# Patient Record
Sex: Female | Born: 1960 | Marital: Single | Smoking: Never smoker
Health system: Southern US, Community
[De-identification: ages and names within clinical notes are randomized; demographics above are authoritative.]

## PROBLEM LIST (undated history)

## (undated) DIAGNOSIS — I10 Essential (primary) hypertension: Secondary | ICD-10-CM

## (undated) DIAGNOSIS — R279 Unspecified lack of coordination: Secondary | ICD-10-CM

## (undated) DIAGNOSIS — R42 Dizziness and giddiness: Secondary | ICD-10-CM

## (undated) DIAGNOSIS — K219 Gastro-esophageal reflux disease without esophagitis: Secondary | ICD-10-CM

## (undated) DIAGNOSIS — E119 Type 2 diabetes mellitus without complications: Secondary | ICD-10-CM

## (undated) DIAGNOSIS — I639 Cerebral infarction, unspecified: Secondary | ICD-10-CM

## (undated) DIAGNOSIS — R4701 Aphasia: Secondary | ICD-10-CM

## (undated) DIAGNOSIS — G8190 Hemiplegia, unspecified affecting unspecified side: Secondary | ICD-10-CM

## (undated) HISTORY — DX: Essential (primary) hypertension: I10

## (undated) HISTORY — DX: Dizziness and giddiness: R42

## (undated) HISTORY — DX: Type 2 diabetes mellitus without complications: E11.9

## (undated) HISTORY — DX: Unspecified lack of coordination: R27.9

## (undated) HISTORY — DX: Gastro-esophageal reflux disease without esophagitis: K21.9

---

## 2007-05-07 ENCOUNTER — Other Ambulatory Visit: Payer: Self-pay

## 2007-05-07 ENCOUNTER — Inpatient Hospital Stay: Payer: Self-pay | Admitting: Internal Medicine

## 2007-05-29 ENCOUNTER — Encounter: Payer: Self-pay | Admitting: Internal Medicine

## 2007-06-03 ENCOUNTER — Encounter: Payer: Self-pay | Admitting: Internal Medicine

## 2007-07-04 ENCOUNTER — Encounter: Payer: Self-pay | Admitting: Internal Medicine

## 2007-08-04 ENCOUNTER — Encounter: Payer: Self-pay | Admitting: Internal Medicine

## 2007-09-03 ENCOUNTER — Encounter: Payer: Self-pay | Admitting: Internal Medicine

## 2007-10-04 ENCOUNTER — Encounter: Payer: Self-pay | Admitting: Internal Medicine

## 2007-11-03 ENCOUNTER — Encounter: Payer: Self-pay | Admitting: Internal Medicine

## 2012-06-02 ENCOUNTER — Ambulatory Visit: Payer: Self-pay | Admitting: Internal Medicine

## 2012-06-05 ENCOUNTER — Inpatient Hospital Stay: Payer: Self-pay | Admitting: Internal Medicine

## 2012-06-05 LAB — TSH: Thyroid Stimulating Horm: 2.7 u[IU]/mL

## 2012-06-05 LAB — URINALYSIS, COMPLETE
Bacteria: NONE SEEN
Glucose,UR: 150 mg/dL (ref 0–75)
Ph: 7 (ref 4.5–8.0)
Protein: 100
RBC,UR: <1 /HPF (ref 0–5)
Specific Gravity: 1.018 (ref 1.003–1.030)
Squamous Epithelial: 1

## 2012-06-05 LAB — COMPREHENSIVE METABOLIC PANEL
Albumin: 4.2 g/dL (ref 3.4–5.0)
Anion Gap: 11 (ref 7–16)
BUN: 17 mg/dL (ref 7–18)
Bilirubin,Total: 0.8 mg/dL (ref 0.2–1.0)
Co2: 29 mmol/L (ref 21–32)
Creatinine: 0.98 mg/dL (ref 0.60–1.30)
EGFR (African American): >60
Glucose: 194 mg/dL — ABNORMAL HIGH (ref 65–99)
Osmolality: 284 (ref 275–301)
SGOT(AST): 28 U/L (ref 15–37)
Sodium: 139 mmol/L (ref 136–145)
Total Protein: 9.5 g/dL — ABNORMAL HIGH (ref 6.4–8.2)

## 2012-06-05 LAB — CK TOTAL AND CKMB (NOT AT ARMC): CK-MB: 1.2 ng/mL (ref 0.5–3.6)

## 2012-06-05 LAB — CBC
HCT: 49.1 % — ABNORMAL HIGH (ref 35.0–47.0)
HGB: 15.8 g/dL (ref 12.0–16.0)
MCH: 28.1 pg (ref 26.0–34.0)
MCV: 88 fL (ref 80–100)
Platelet: 245 10*3/uL (ref 150–440)
RBC: 5.61 10*6/uL — ABNORMAL HIGH (ref 3.80–5.20)
RDW: 13.8 % (ref 11.5–14.5)
WBC: 8.9 10*3/uL (ref 3.6–11.0)

## 2012-06-05 LAB — PROTIME-INR: INR: 0.9

## 2012-06-05 LAB — TROPONIN I: Troponin-I: 0.03 ng/mL

## 2012-06-05 LAB — APTT: Activated PTT: 34.2 secs (ref 23.6–35.9)

## 2012-06-06 DIAGNOSIS — I517 Cardiomegaly: Secondary | ICD-10-CM

## 2012-06-06 LAB — BASIC METABOLIC PANEL
Anion Gap: 8 (ref 7–16)
BUN: 13 mg/dL (ref 7–18)
Co2: 26 mmol/L (ref 21–32)
Creatinine: 1.04 mg/dL (ref 0.60–1.30)
EGFR (African American): >60
EGFR (Non-African Amer.): >60
Glucose: 222 mg/dL — ABNORMAL HIGH (ref 65–99)
Osmolality: 288 (ref 275–301)
Sodium: 141 mmol/L (ref 136–145)

## 2012-06-06 LAB — TROPONIN I
Troponin-I: 0.06 ng/mL — ABNORMAL HIGH
Troponin-I: 0.06 ng/mL — ABNORMAL HIGH

## 2012-06-06 LAB — CBC WITH DIFFERENTIAL/PLATELET
Basophil %: 0.7 %
Eosinophil #: 0.1 10*3/uL (ref 0.0–0.7)
Lymphocyte #: 3.1 10*3/uL (ref 1.0–3.6)
MCH: 28.5 pg (ref 26.0–34.0)
MCHC: 32.9 g/dL (ref 32.0–36.0)
MCV: 87 fL (ref 80–100)
Monocyte #: 1.2 x10 3/mm — ABNORMAL HIGH (ref 0.2–0.9)
Neutrophil %: 63.5 %
Platelet: 233 10*3/uL (ref 150–440)

## 2012-06-06 LAB — LIPID PANEL
HDL Cholesterol: 55 mg/dL (ref 40–60)
Triglycerides: 98 mg/dL (ref 0–200)

## 2012-06-06 LAB — MAGNESIUM: Magnesium: 1.7 mg/dL — ABNORMAL LOW

## 2012-06-07 LAB — HEMOGLOBIN A1C: Hemoglobin A1C: 8.9 % — ABNORMAL HIGH (ref 4.2–6.3)

## 2012-06-07 LAB — BASIC METABOLIC PANEL
Anion Gap: 10 (ref 7–16)
BUN: 8 mg/dL (ref 7–18)
EGFR (African American): >60
EGFR (Non-African Amer.): >60
Glucose: 189 mg/dL — ABNORMAL HIGH (ref 65–99)
Potassium: 3.5 mmol/L (ref 3.5–5.1)

## 2012-06-07 LAB — MAGNESIUM: Magnesium: 1.9 mg/dL

## 2012-06-08 ENCOUNTER — Ambulatory Visit: Payer: Self-pay | Admitting: Neurology

## 2012-06-08 LAB — URINALYSIS, COMPLETE
Bilirubin,UR: NEGATIVE
Glucose,UR: 50 mg/dL (ref 0–75)
Hyaline Cast: 3
Nitrite: NEGATIVE
Ph: 6 (ref 4.5–8.0)
RBC,UR: 469 /HPF (ref 0–5)
Squamous Epithelial: <1
WBC UR: 21 /HPF (ref 0–5)

## 2012-06-08 LAB — SEDIMENTATION RATE: Erythrocyte Sed Rate: 4 mm/hr (ref 0–20)

## 2012-06-10 LAB — URINE CULTURE

## 2012-06-11 LAB — CREATININE, SERUM
EGFR (African American): >60
EGFR (Non-African Amer.): >60

## 2012-06-11 LAB — PHOSPHORUS: Phosphorus: 3.1 mg/dL (ref 2.5–4.9)

## 2012-06-11 LAB — MAGNESIUM: Magnesium: 1.4 mg/dL — ABNORMAL LOW

## 2012-06-12 DIAGNOSIS — G459 Transient cerebral ischemic attack, unspecified: Secondary | ICD-10-CM

## 2012-06-12 LAB — MAGNESIUM: Magnesium: 1.9 mg/dL

## 2012-06-12 LAB — PHOSPHORUS: Phosphorus: 3.2 mg/dL (ref 2.5–4.9)

## 2012-06-12 LAB — POTASSIUM: Potassium: 3.4 mmol/L — ABNORMAL LOW

## 2012-06-12 LAB — CALCIUM: Calcium, Total: 8.4 mg/dL — ABNORMAL LOW

## 2012-06-13 LAB — PROT IMMUNOELECTROPHORES(ARMC)

## 2012-06-14 LAB — CALCIUM: Calcium, Total: 8.4 mg/dL — ABNORMAL LOW (ref 8.5–10.1)

## 2012-06-14 LAB — HEMOGLOBIN A1C: Hemoglobin A1C: 8.9 % — ABNORMAL HIGH (ref 4.2–6.3)

## 2012-06-15 LAB — POTASSIUM: Potassium: 3 mmol/L — ABNORMAL LOW (ref 3.5–5.1)

## 2012-06-15 LAB — MAGNESIUM: Magnesium: 1.9 mg/dL

## 2012-06-16 LAB — CBC WITH DIFFERENTIAL/PLATELET
Basophil %: 0.5 %
Eosinophil #: 0.3 10*3/uL (ref 0.0–0.7)
Eosinophil %: 3.2 %
Lymphocyte #: 1.5 10*3/uL (ref 1.0–3.6)
MCHC: 32.7 g/dL (ref 32.0–36.0)
MCV: 89 fL (ref 80–100)
Monocyte #: 1.1 x10 3/mm — ABNORMAL HIGH (ref 0.2–0.9)
Monocyte %: 12.9 %
Neutrophil #: 5.5 10*3/uL (ref 1.4–6.5)
Neutrophil %: 65.5 %
Platelet: 212 10*3/uL (ref 150–440)
RBC: 4.11 10*6/uL (ref 3.80–5.20)
RDW: 15.1 % — ABNORMAL HIGH (ref 11.5–14.5)
WBC: 8.3 10*3/uL (ref 3.6–11.0)

## 2012-06-16 LAB — BASIC METABOLIC PANEL
Anion Gap: 7 (ref 7–16)
BUN: 24 mg/dL — ABNORMAL HIGH (ref 7–18)
Chloride: 107 mmol/L (ref 98–107)
Co2: 31 mmol/L (ref 21–32)
Creatinine: 1.11 mg/dL (ref 0.60–1.30)
EGFR (African American): >60
EGFR (Non-African Amer.): 58 — ABNORMAL LOW
Glucose: 441 mg/dL — ABNORMAL HIGH (ref 65–99)
Osmolality: 312 (ref 275–301)

## 2012-06-16 LAB — MAGNESIUM: Magnesium: 1.9 mg/dL

## 2012-06-17 LAB — POTASSIUM: Potassium: 3.7 mmol/L (ref 3.5–5.1)

## 2012-06-17 LAB — MAGNESIUM: Magnesium: 1.8 mg/dL

## 2012-06-17 LAB — PHOSPHORUS: Phosphorus: 3.6 mg/dL (ref 2.5–4.9)

## 2012-06-17 LAB — ALBUMIN: Albumin: 2.5 g/dL — ABNORMAL LOW (ref 3.4–5.0)

## 2012-06-19 LAB — CBC WITH DIFFERENTIAL/PLATELET
Bands: 1 %
Comment - H1-Com1: NORMAL
HCT: 36.6 % (ref 35.0–47.0)
HGB: 11.6 g/dL — ABNORMAL LOW (ref 12.0–16.0)
Lymphocytes: 21 %
MCH: 28.3 pg (ref 26.0–34.0)
MCHC: 31.7 g/dL — ABNORMAL LOW (ref 32.0–36.0)
MCV: 89 fL (ref 80–100)
Monocytes: 9 %
Platelet: 210 10*3/uL (ref 150–440)
RBC: 4.11 10*6/uL (ref 3.80–5.20)
Segmented Neutrophils: 69 %

## 2012-06-19 LAB — URINALYSIS, COMPLETE
Bilirubin,UR: NEGATIVE
Glucose,UR: 50 mg/dL (ref 0–75)
Ketone: NEGATIVE
Protein: 100
RBC,UR: 15 /HPF (ref 0–5)
Specific Gravity: 1.015 (ref 1.003–1.030)
WBC UR: 191 /HPF (ref 0–5)

## 2012-06-19 LAB — PHOSPHORUS: Phosphorus: 3.1 mg/dL (ref 2.5–4.9)

## 2012-06-19 LAB — MAGNESIUM: Magnesium: 1.7 mg/dL — ABNORMAL LOW

## 2012-06-20 LAB — CBC WITH DIFFERENTIAL/PLATELET
Basophil #: 0.1 10*3/uL (ref 0.0–0.1)
Eosinophil #: 0.2 10*3/uL (ref 0.0–0.7)
HCT: 38.3 % (ref 35.0–47.0)
Lymphocyte %: 10.6 %
MCHC: 32.5 g/dL (ref 32.0–36.0)
Monocyte %: 13.9 %
Neutrophil #: 17 10*3/uL — ABNORMAL HIGH (ref 1.4–6.5)
Platelet: 233 10*3/uL (ref 150–440)
RBC: 4.34 10*6/uL (ref 3.80–5.20)
RDW: 14.7 % — ABNORMAL HIGH (ref 11.5–14.5)

## 2012-06-20 LAB — BASIC METABOLIC PANEL
Anion Gap: 10 (ref 7–16)
BUN: 33 mg/dL — ABNORMAL HIGH (ref 7–18)
Creatinine: 1.22 mg/dL (ref 0.60–1.30)
EGFR (African American): >60
EGFR (Non-African Amer.): 52 — ABNORMAL LOW
Glucose: 128 mg/dL — ABNORMAL HIGH (ref 65–99)
Osmolality: 283 (ref 275–301)
Potassium: 3.6 mmol/L (ref 3.5–5.1)

## 2012-06-20 LAB — CALCIUM: Calcium, Total: 9.2 mg/dL (ref 8.5–10.1)

## 2012-06-20 LAB — MAGNESIUM: Magnesium: 2.3 mg/dL

## 2012-06-21 LAB — CBC WITH DIFFERENTIAL/PLATELET
Basophil %: 0.3 %
Eosinophil %: 0.8 %
HCT: 39.8 % (ref 35.0–47.0)
HGB: 12.6 g/dL (ref 12.0–16.0)
Lymphocyte %: 12.5 %
MCH: 28 pg (ref 26.0–34.0)
MCHC: 31.8 g/dL — ABNORMAL LOW (ref 32.0–36.0)
Monocyte #: 2.7 x10 3/mm — ABNORMAL HIGH (ref 0.2–0.9)
Monocyte %: 11.1 %
Neutrophil %: 75.3 %
RBC: 4.51 10*6/uL (ref 3.80–5.20)
WBC: 24.5 10*3/uL — ABNORMAL HIGH (ref 3.6–11.0)

## 2012-06-21 LAB — BASIC METABOLIC PANEL
BUN: 33 mg/dL — ABNORMAL HIGH (ref 7–18)
Creatinine: 1.18 mg/dL (ref 0.60–1.30)
EGFR (African American): >60
EGFR (Non-African Amer.): 54 — ABNORMAL LOW
Glucose: 82 mg/dL (ref 65–99)
Osmolality: 278 (ref 275–301)
Sodium: 136 mmol/L (ref 136–145)

## 2012-06-21 LAB — PHOSPHORUS: Phosphorus: 3.5 mg/dL (ref 2.5–4.9)

## 2012-06-21 LAB — SEDIMENTATION RATE: Erythrocyte Sed Rate: 12 mm/hr (ref 0–20)

## 2012-06-22 LAB — CBC WITH DIFFERENTIAL/PLATELET
Basophil %: 0.2 %
Eosinophil %: 1.2 %
HCT: 39.9 % (ref 35.0–47.0)
Lymphocyte %: 8.3 %
MCV: 89 fL (ref 80–100)
Monocyte #: 1.8 x10 3/mm — ABNORMAL HIGH (ref 0.2–0.9)
Monocyte %: 9.1 %
Neutrophil %: 81.2 %
Platelet: 260 10*3/uL (ref 150–440)
RBC: 4.5 10*6/uL (ref 3.80–5.20)
WBC: 19.3 10*3/uL — ABNORMAL HIGH (ref 3.6–11.0)

## 2012-06-22 LAB — URINE CULTURE

## 2012-06-23 LAB — BASIC METABOLIC PANEL
Anion Gap: 11 (ref 7–16)
Calcium, Total: 9.3 mg/dL (ref 8.5–10.1)
Co2: 30 mmol/L (ref 21–32)
Creatinine: 1.19 mg/dL (ref 0.60–1.30)
EGFR (African American): >60
Potassium: 3.8 mmol/L (ref 3.5–5.1)
Sodium: 137 mmol/L (ref 136–145)

## 2012-06-23 LAB — CBC WITH DIFFERENTIAL/PLATELET
Basophil #: 0.1 10*3/uL (ref 0.0–0.1)
Basophil %: 0.3 %
Eosinophil #: 0.4 10*3/uL (ref 0.0–0.7)
Eosinophil %: 2.3 %
HCT: 33.1 % — ABNORMAL LOW (ref 35.0–47.0)
HGB: 10.9 g/dL — ABNORMAL LOW (ref 12.0–16.0)
Lymphocyte #: 1.8 10*3/uL (ref 1.0–3.6)
MCH: 28.8 pg (ref 26.0–34.0)
MCV: 88 fL (ref 80–100)
Monocyte #: 1.6 x10 3/mm — ABNORMAL HIGH (ref 0.2–0.9)
Monocyte %: 9.2 %
Neutrophil #: 13.1 10*3/uL — ABNORMAL HIGH (ref 1.4–6.5)
RBC: 3.78 10*6/uL — ABNORMAL LOW (ref 3.80–5.20)
RDW: 14.6 % — ABNORMAL HIGH (ref 11.5–14.5)
WBC: 16.9 10*3/uL — ABNORMAL HIGH (ref 3.6–11.0)

## 2012-06-23 LAB — VANCOMYCIN, TROUGH: Vancomycin, Trough: 13 ug/mL (ref 10–20)

## 2012-06-24 LAB — CBC WITH DIFFERENTIAL/PLATELET
Basophil %: 0.5 %
Eosinophil #: 0.4 10*3/uL (ref 0.0–0.7)
Eosinophil %: 2.8 %
HCT: 38.2 % (ref 35.0–47.0)
HGB: 11.8 g/dL — ABNORMAL LOW (ref 12.0–16.0)
Lymphocyte #: 1.6 10*3/uL (ref 1.0–3.6)
Lymphocyte %: 11 %
MCHC: 30.9 g/dL — ABNORMAL LOW (ref 32.0–36.0)
MCV: 89 fL (ref 80–100)
Neutrophil #: 10.9 10*3/uL — ABNORMAL HIGH (ref 1.4–6.5)
RBC: 4.28 10*6/uL (ref 3.80–5.20)
RDW: 14.9 % — ABNORMAL HIGH (ref 11.5–14.5)

## 2012-06-25 LAB — CBC WITH DIFFERENTIAL/PLATELET
Basophil #: 0 10*3/uL (ref 0.0–0.1)
Eosinophil #: 0.1 10*3/uL (ref 0.0–0.7)
HCT: 35.3 % (ref 35.0–47.0)
HGB: 10.8 g/dL — ABNORMAL LOW (ref 12.0–16.0)
Lymphocyte %: 6.8 %
MCH: 27 pg (ref 26.0–34.0)
MCHC: 30.6 g/dL — ABNORMAL LOW (ref 32.0–36.0)
MCV: 88 fL (ref 80–100)
Monocyte #: 1.9 x10 3/mm — ABNORMAL HIGH (ref 0.2–0.9)
Neutrophil #: 17.7 10*3/uL — ABNORMAL HIGH (ref 1.4–6.5)
Neutrophil %: 83.8 %
RDW: 14.5 % (ref 11.5–14.5)

## 2012-06-25 LAB — CULTURE, BLOOD (SINGLE)

## 2012-06-26 LAB — BASIC METABOLIC PANEL
Anion Gap: 10 (ref 7–16)
BUN: 20 mg/dL — ABNORMAL HIGH (ref 7–18)
Chloride: 100 mmol/L (ref 98–107)
Co2: 26 mmol/L (ref 21–32)
Creatinine: 1.21 mg/dL (ref 0.60–1.30)
EGFR (Non-African Amer.): 52 — ABNORMAL LOW
Glucose: 92 mg/dL (ref 65–99)
Osmolality: 274 (ref 275–301)
Potassium: 4.2 mmol/L (ref 3.5–5.1)
Sodium: 136 mmol/L (ref 136–145)

## 2012-06-26 LAB — CBC WITH DIFFERENTIAL/PLATELET
Basophil %: 0.3 %
Eosinophil %: 1.8 %
HGB: 11.5 g/dL — ABNORMAL LOW (ref 12.0–16.0)
Lymphocyte #: 2.5 10*3/uL (ref 1.0–3.6)
Lymphocyte %: 13.2 %
MCH: 28.9 pg (ref 26.0–34.0)
MCV: 88 fL (ref 80–100)
Monocyte #: 2 x10 3/mm — ABNORMAL HIGH (ref 0.2–0.9)
Neutrophil %: 74.2 %
Platelet: 511 10*3/uL — ABNORMAL HIGH (ref 150–440)
RBC: 3.99 10*6/uL (ref 3.80–5.20)
WBC: 19.3 10*3/uL — ABNORMAL HIGH (ref 3.6–11.0)

## 2012-06-27 LAB — BASIC METABOLIC PANEL
BUN: 17 mg/dL (ref 7–18)
Calcium, Total: 9.2 mg/dL (ref 8.5–10.1)
EGFR (African American): >60
EGFR (Non-African Amer.): >60
Glucose: 91 mg/dL (ref 65–99)
Osmolality: 269 (ref 275–301)
Sodium: 134 mmol/L — ABNORMAL LOW (ref 136–145)

## 2012-06-27 LAB — CBC WITH DIFFERENTIAL/PLATELET
Eosinophil %: 1.3 %
HCT: 35 % (ref 35.0–47.0)
Lymphocyte #: 1.8 10*3/uL (ref 1.0–3.6)
MCV: 87 fL (ref 80–100)
Monocyte %: 9.2 %
Neutrophil #: 16.5 10*3/uL — ABNORMAL HIGH (ref 1.4–6.5)
Neutrophil %: 80.6 %
Platelet: 622 10*3/uL — ABNORMAL HIGH (ref 150–440)
RBC: 4.01 10*6/uL (ref 3.80–5.20)
WBC: 20.4 10*3/uL — ABNORMAL HIGH (ref 3.6–11.0)

## 2012-06-28 LAB — CBC WITH DIFFERENTIAL/PLATELET
Basophil #: 0.1 10*3/uL (ref 0.0–0.1)
Basophil %: 0.5 %
Eosinophil #: 0.3 10*3/uL (ref 0.0–0.7)
HCT: 36.1 % (ref 35.0–47.0)
HGB: 12.4 g/dL (ref 12.0–16.0)
Lymphocyte #: 2.4 10*3/uL (ref 1.0–3.6)
Lymphocyte %: 18.9 %
MCH: 29.5 pg (ref 26.0–34.0)
MCHC: 34.4 g/dL (ref 32.0–36.0)
MCV: 86 fL (ref 80–100)
Monocyte #: 1.3 x10 3/mm — ABNORMAL HIGH (ref 0.2–0.9)
Neutrophil #: 8.5 10*3/uL — ABNORMAL HIGH (ref 1.4–6.5)
Platelet: 734 10*3/uL — ABNORMAL HIGH (ref 150–440)
RDW: 14.4 % (ref 11.5–14.5)
WBC: 12.6 10*3/uL — ABNORMAL HIGH (ref 3.6–11.0)

## 2012-06-28 LAB — BASIC METABOLIC PANEL
Anion Gap: 10 (ref 7–16)
Calcium, Total: 8.8 mg/dL (ref 8.5–10.1)
Chloride: 100 mmol/L (ref 98–107)
Co2: 26 mmol/L (ref 21–32)
Osmolality: 277 (ref 275–301)
Potassium: 4.3 mmol/L (ref 3.5–5.1)

## 2012-06-30 LAB — CBC WITH DIFFERENTIAL/PLATELET
Basophil %: 0.7 %
Eosinophil #: 0.3 10*3/uL (ref 0.0–0.7)
Eosinophil %: 2.6 %
HGB: 12.8 g/dL (ref 12.0–16.0)
Lymphocyte #: 4.1 10*3/uL — ABNORMAL HIGH (ref 1.0–3.6)
Lymphocyte %: 32.1 %
MCH: 29.4 pg (ref 26.0–34.0)
MCHC: 34.3 g/dL (ref 32.0–36.0)
MCV: 86 fL (ref 80–100)
Monocyte #: 1.4 x10 3/mm — ABNORMAL HIGH (ref 0.2–0.9)
Platelet: 919 10*3/uL — ABNORMAL HIGH (ref 150–440)
RBC: 4.34 10*6/uL (ref 3.80–5.20)
WBC: 12.9 10*3/uL — ABNORMAL HIGH (ref 3.6–11.0)

## 2012-07-01 LAB — CBC WITH DIFFERENTIAL/PLATELET
Eosinophil #: 0.2 10*3/uL (ref 0.0–0.7)
Eosinophil %: 1.6 %
MCH: 29 pg (ref 26.0–34.0)
MCHC: 33.4 g/dL (ref 32.0–36.0)
Monocyte #: 0.8 x10 3/mm (ref 0.2–0.9)
Monocyte %: 7.2 %
Neutrophil %: 69 %
Platelet: 844 10*3/uL — ABNORMAL HIGH (ref 150–440)
RBC: 4.68 10*6/uL (ref 3.80–5.20)
WBC: 11.5 10*3/uL — ABNORMAL HIGH (ref 3.6–11.0)

## 2012-07-03 ENCOUNTER — Ambulatory Visit: Payer: Self-pay | Admitting: Internal Medicine

## 2013-02-26 IMAGING — CR DG CHEST 1V PORT
1 series · 1 of 1 positions shown · non-contrast
Comparison: none

REASON FOR EXAM: fever
COMMENTS:

[portable]
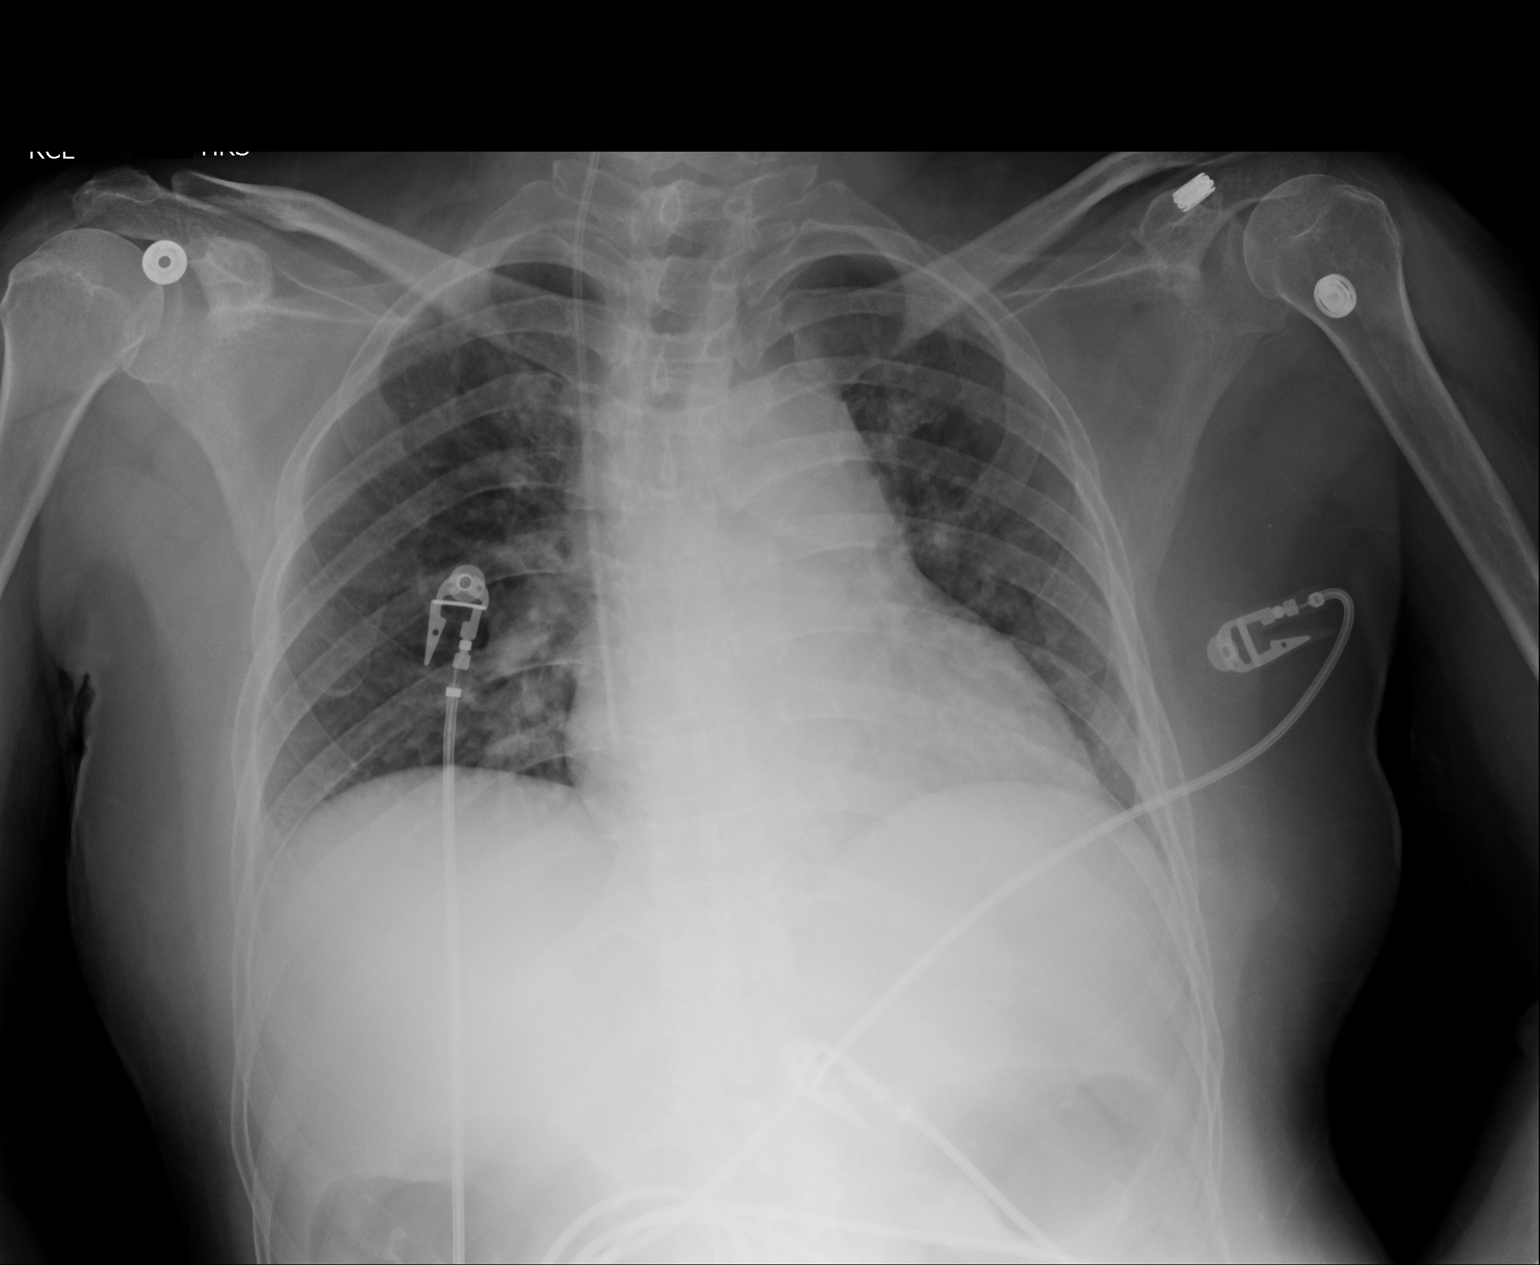

[1 of 1 positions shown; findings below may reference images not displayed]

PROCEDURE:     DXR - DXR PORTABLE CHEST SINGLE VIEW  - June 19, 2012  [DATE]

RESULT:     Comparison is made to the study June 11, 2012.

The lungs are mildly hypoinflated. The cardiac silhouette is enlarged. The
pulmonary vascularity is prominent centrally. There is a right internal
jugular venous catheter in place whose tip lies in the region of the
junction of the SVC with the right atrium. No discrete alveolar infiltrate
is seen.
IMPRESSION: There are increased interstitial markings that suggest mild
interstitial edema. I see no alveolar infiltrate.

## 2013-03-01 IMAGING — CR DG ABDOMEN 1V
1 series · 2 of 2 positions shown · non-contrast
Comparison: none

REASON FOR EXAM: peg tube location, swelliing around site
COMMENTS:

[Series 1: supine ap · 0.17mm/px · 2 of 2 slices shown]
[im 1/2]
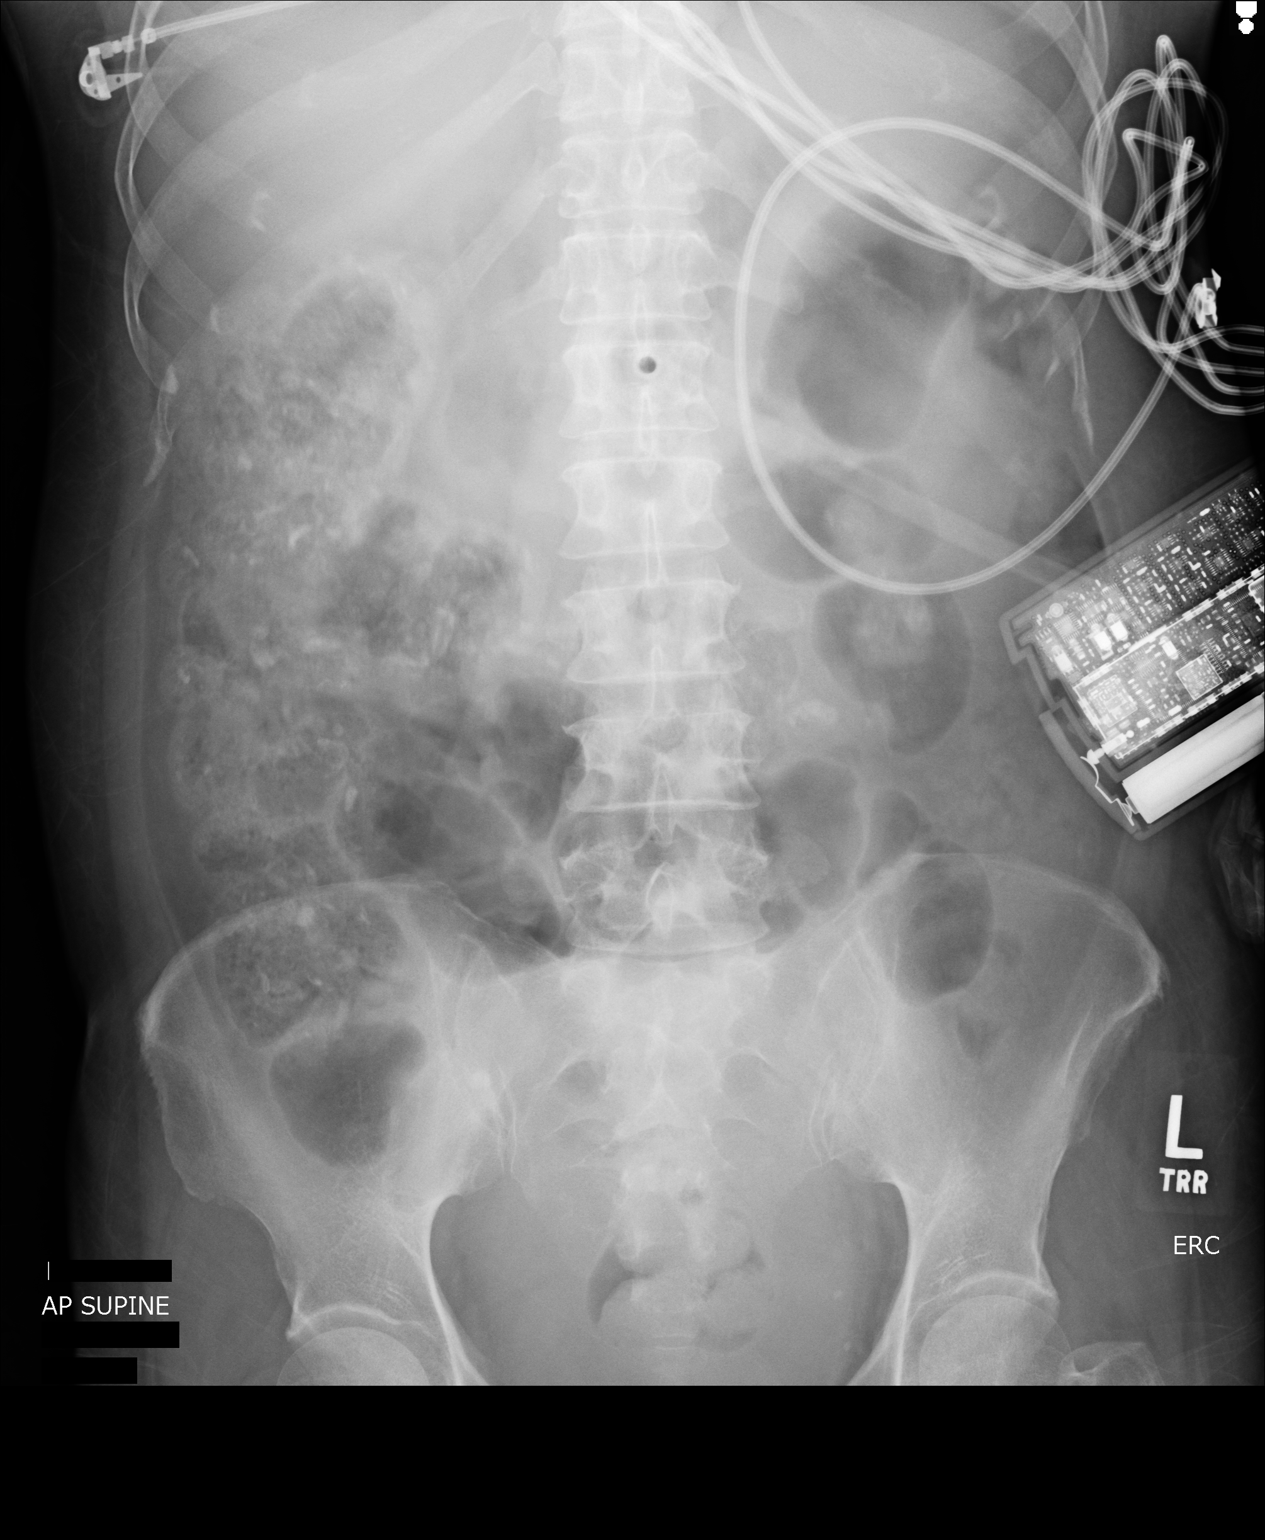
[im 2/2]
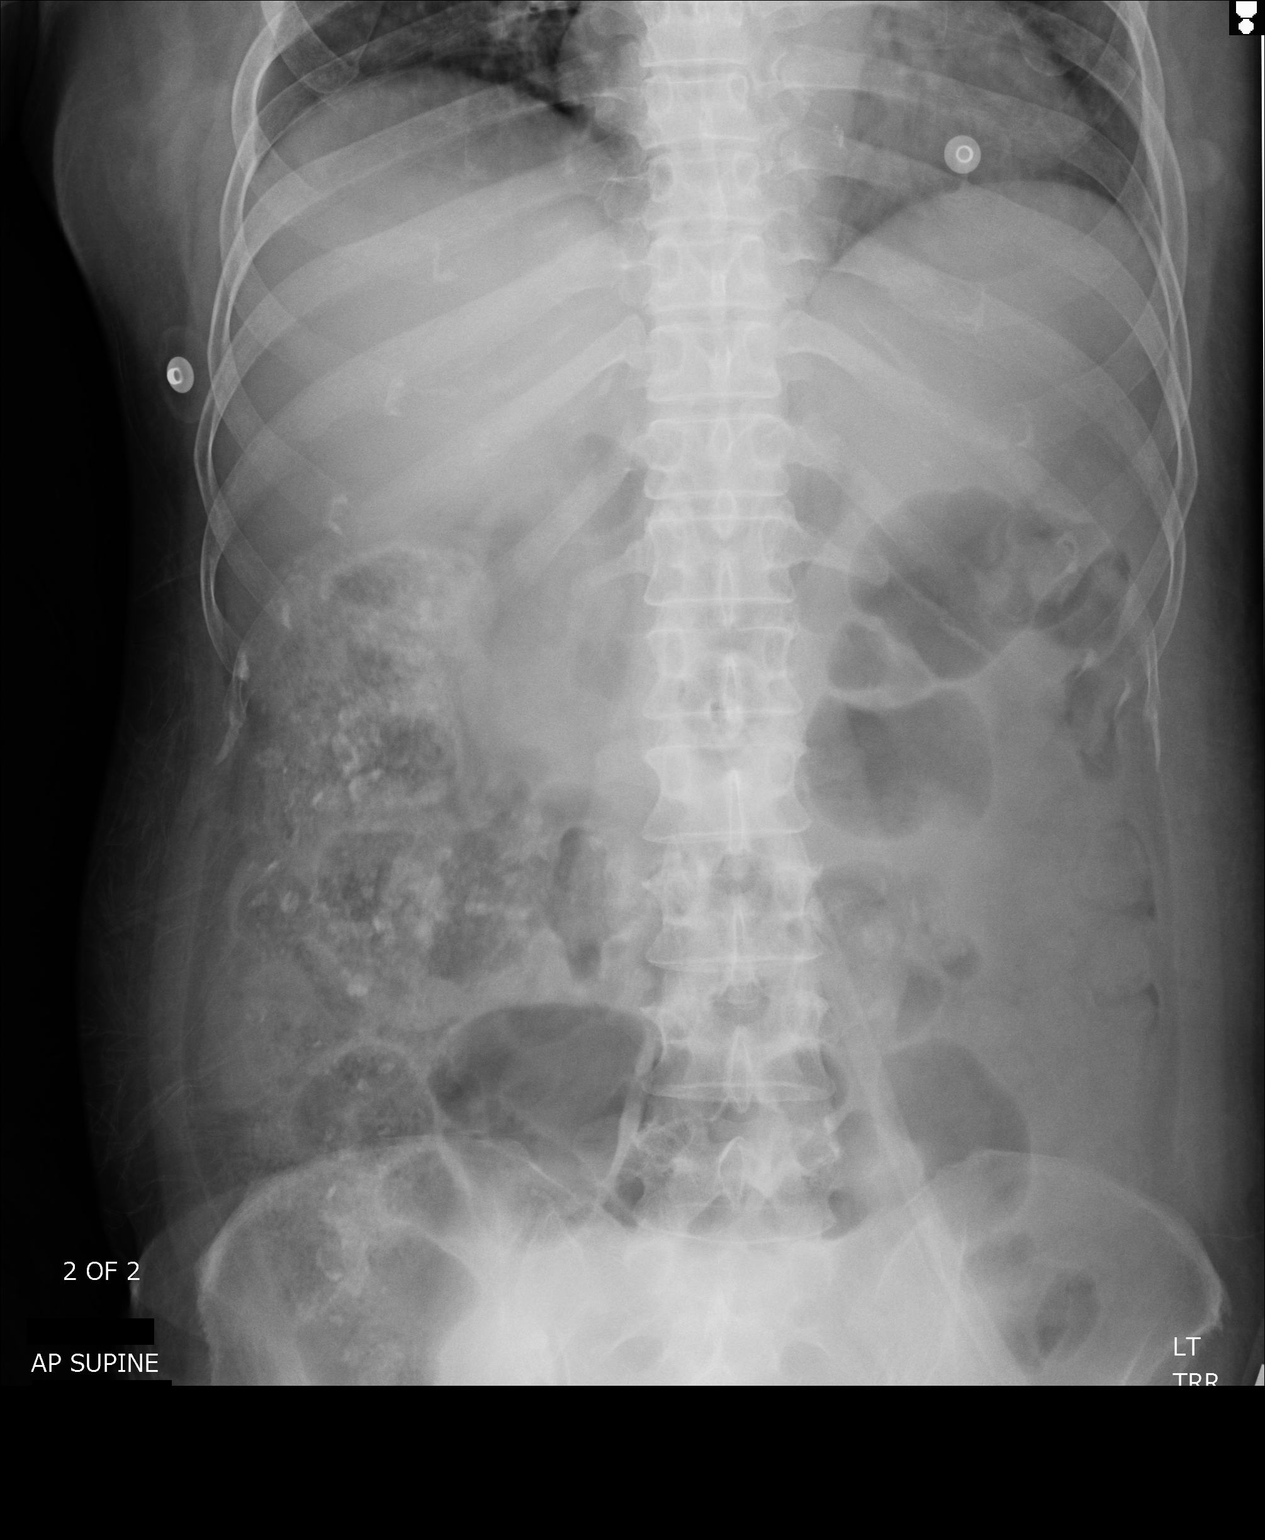

[2 of 2 positions shown; findings below may reference images not displayed]

PROCEDURE:     DXR - DXR ABDOMEN AP ONLY  - June 22, 2012  [DATE]

RESULT:      This study is compared to a prior study dated 06/10/2012.

Air is seen within nondilated loops of large and small bowel. A pocket of
air projects in the ascending colon region as well as a small to moderate
amount of stool. The visualized bony skeleton is unremarkable. A gastric
feeding tube is partially identified.
IMPRESSION: Nonobstructive nonspecific bowel gas pattern.

## 2013-03-07 IMAGING — CT CT ABDOMEN W/ CM
1 of 2 series · 15 of 32 positions shown, 19 images · non-contrast
Comparison: none

REASON FOR EXAM: peg site infection, ? deeper process.
COMMENTS:

[Series 2: 3mm soft tissue · axial · 0.62mm/px · z∈[-918,-590]mm · 15 of 121 slices shown, 19 images]
[im 6/121  soft-tissue]
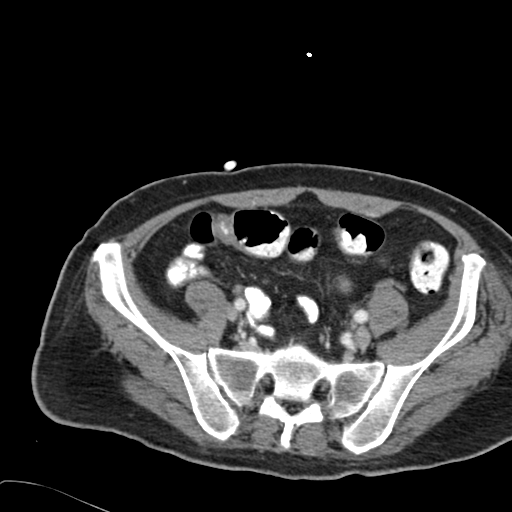
[im 6/121  bone]
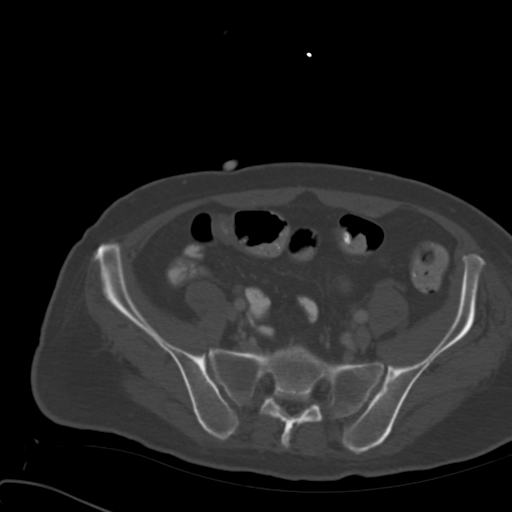
[im 16/121  soft-tissue]
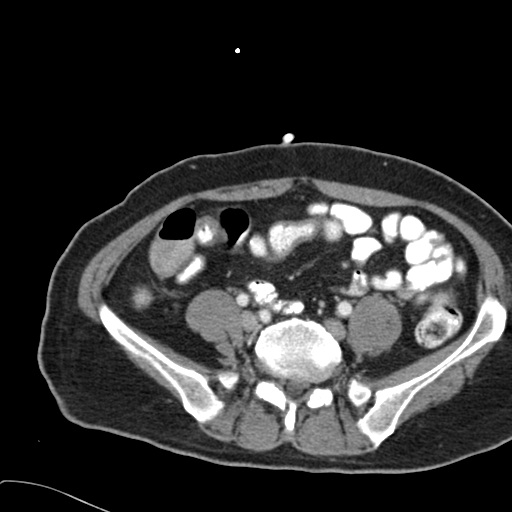
[im 27/121  soft-tissue]
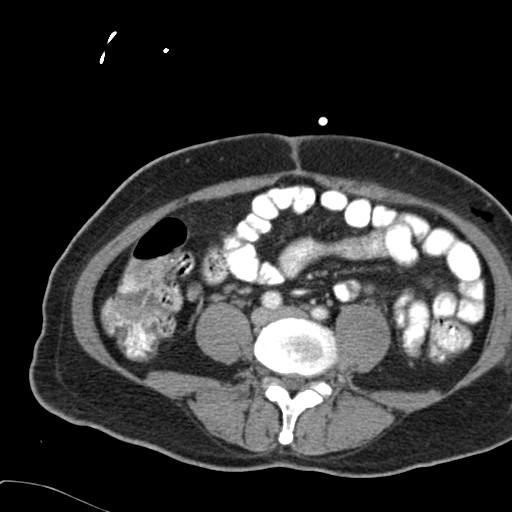
[im 32/121  soft-tissue]
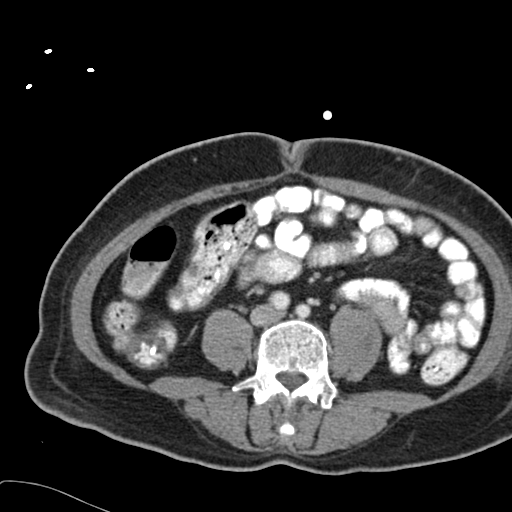
[im 42/121  soft-tissue]
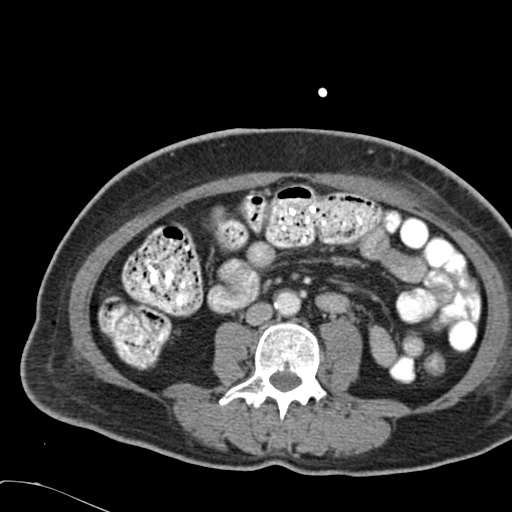
[im 53/121  soft-tissue]
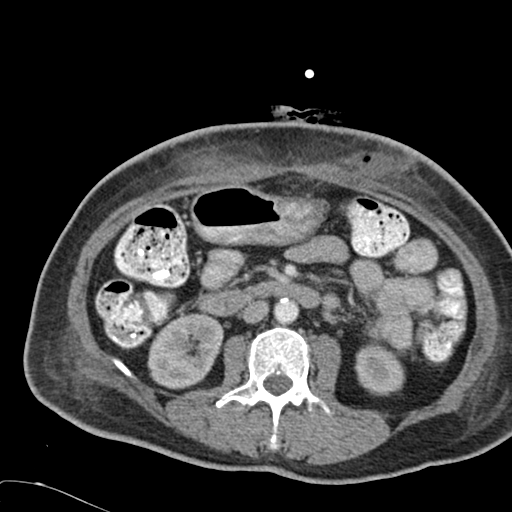
[im 63/121  soft-tissue]
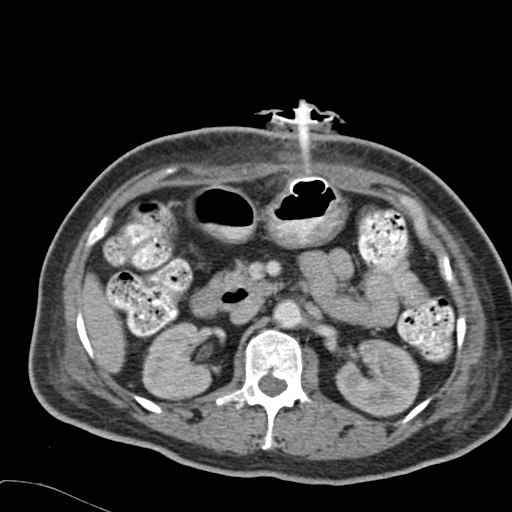
[im 68/121  soft-tissue]
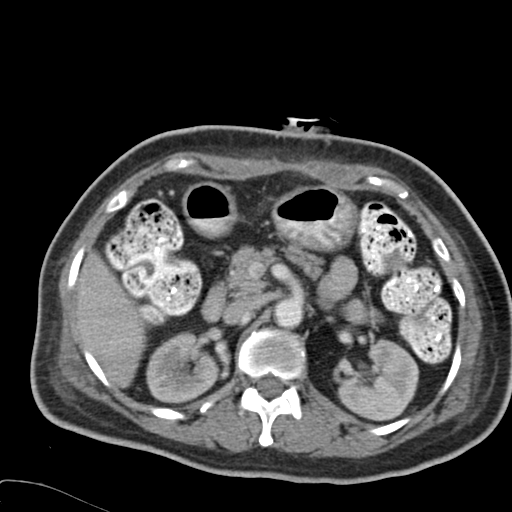
[im 79/121  soft-tissue]
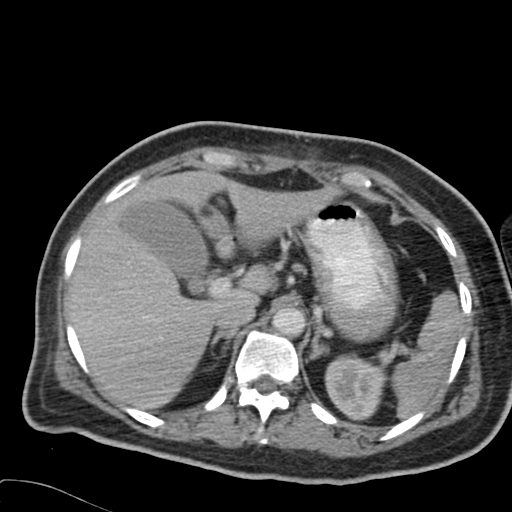
[im 79/121  bone]
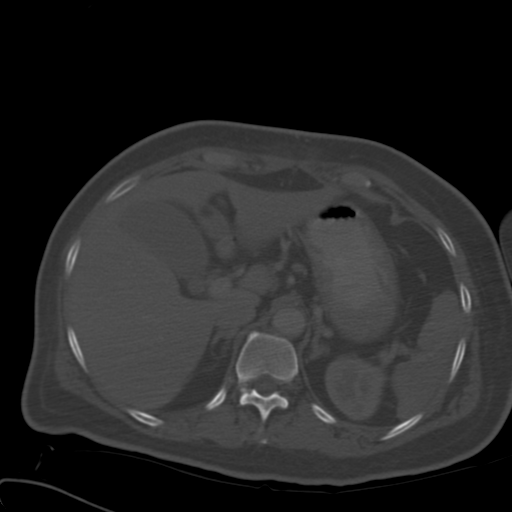
[im 89/121  soft-tissue]
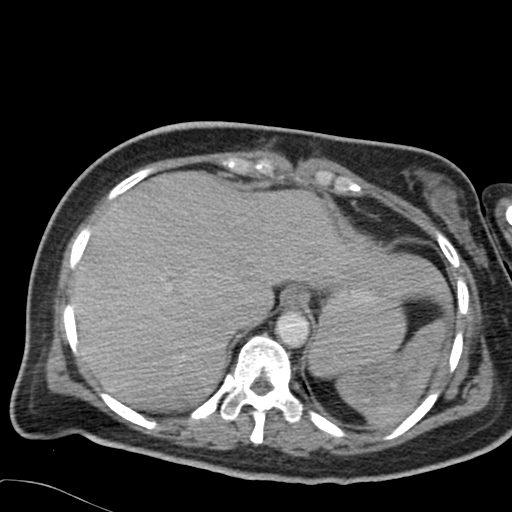
[im 94/121  soft-tissue]
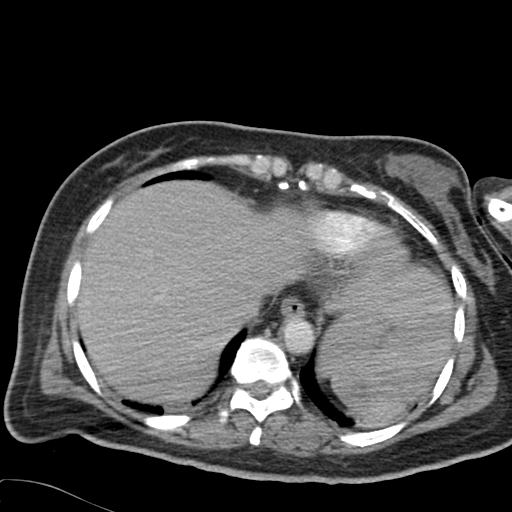
[im 100/121  lung]
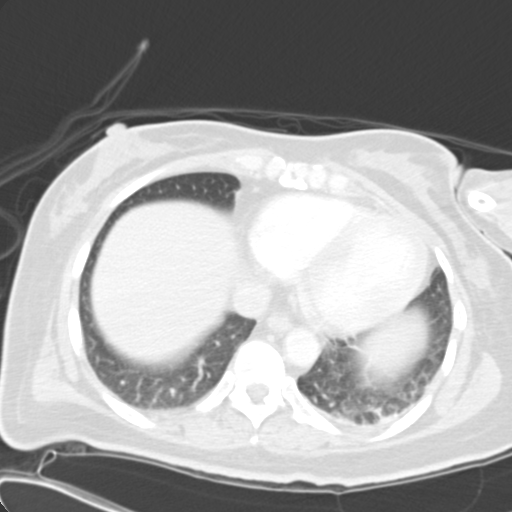
[im 105/121  soft-tissue]
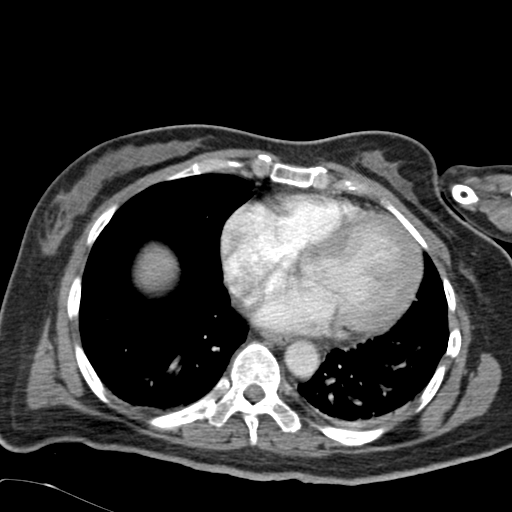
[im 105/121  lung]
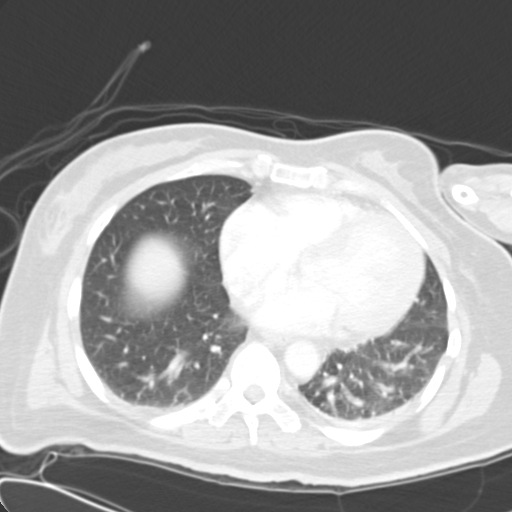
[im 110/121  lung]
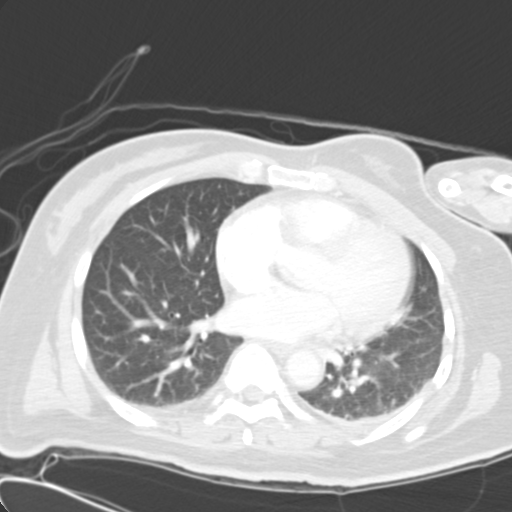
[im 115/121  soft-tissue]
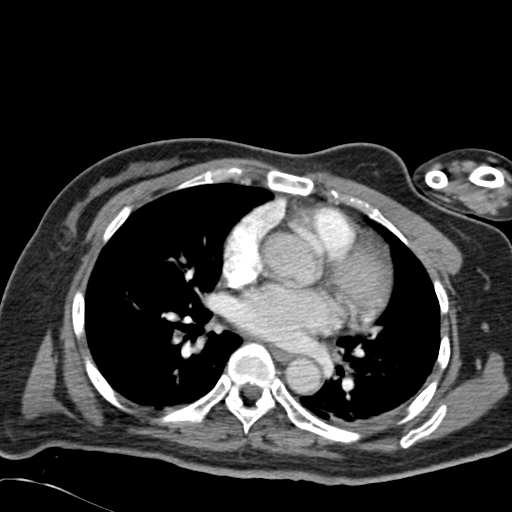
[im 115/121  lung]
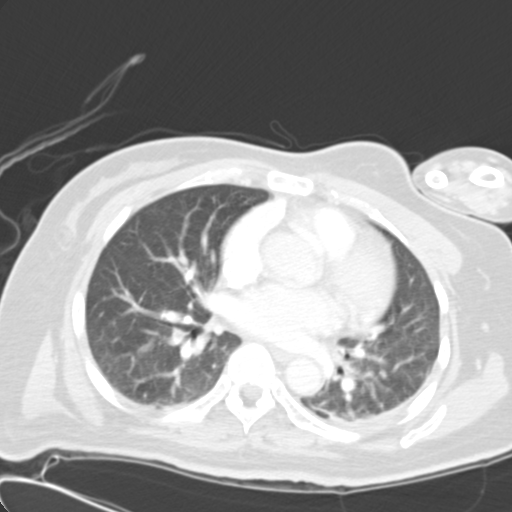

[15 of 32 positions shown; findings below may reference images not displayed]

PROCEDURE:     CT  - CT ABDOMEN STANDARD W  - June 28, 2012  [DATE]

RESULT:     Axial CT scanning was performed through the abdomen with
reconstructions at 3 mm intervals and slice thicknesses. The patient
received contrast via the PET 2. The patient also received 100 cc of
Vsovue-ZFF intravenously. Comparison is made to study performed 5 days ago.

Associated with the insertion of the PEG tube there is a small amount of
subcutaneous gas. No abscess is demonstrated. Small amounts of increased
density in the subcutaneous fat are seen associated with the gas
collections. Within the peritoneal cavity I do not see evidence of an
abscess.

The liver, spleen, partially distended stomach, pancreas, adrenal glands,
and kidneys are normal in appearance. The gallbladder is mildly distended.
The partially contrast-filled loops of small and large bowel appear normal
where visualized. A normal calibered appendix is demonstrated.

The lung bases exhibit atelectasis and small amounts of pleural fluid.
IMPRESSION: 1. There remains gas within the subcutaneous and deeper soft tissues of the
anterior abdominal wall adjacent to the PEG insertion site. There is soft
tissue density material as well. Overall there is a decrease in the volume
of gas present of the density of the fat has increased suggesting some
progressive inflammation. No discrete drainable abscess is demonstrated.
2. There is no evidence of ileus or obstruction of the observed portions of
the bowel. There may be an element of constipation present.
3. There is mild the distention of the gallbladder which is not new. No
acute abnormality of the liver or spleen or pancreas or kidneys is seen.

[REDACTED]

## 2015-03-22 NOTE — Consult Note (Signed)
Chief Complaint:   Subjective/Chief Complaint minimal change.  case discussed with patients son.   VITAL SIGNS/ANCILLARY NOTES: **Vital Signs.:   15-Jul-13 12:14   Vital Signs Type Routine   Temperature Temperature (F) 98   Celsius 36.6   Temperature Source Oral   Pulse Pulse 111   Pulse source if not from Vital Sign Device per cardiac monitor   Systolic BP Systolic BP 546   Diastolic BP (mmHg) Diastolic BP (mmHg) 88   Mean BP 116   Pulse Ox % Pulse Ox % 95   Pulse Ox Activity Level  At rest   Oxygen Delivery Room Air/ 21 %   Pulse Ox Heart Rate 76   Brief Assessment:   Cardiac Regular    Respiratory clear BS    Gastrointestinal details normal Soft  Nontender  Nondistended  No masses palpable  Bowel sounds normal   Lab Results: General Ref:  12-Jul-13 06:28    Prealbumin ========== TEST NAME ==========  ========= RESULTS =========  = REFERENCE RANGE =  TPN PANEL  Prealbumin Prealbumin                      [L  13 mg/dL             ]             20-40               [facility]            No: [id number]           [address], [place], [place] [address]           Lindon Romp, MD         [phone]   Result(s) reported on 14 Jun 2012 at 04:49AM.  Routine Chem:  15-Jul-13 04:25    Glucose, Serum  441   BUN  24   Creatinine (comp) 1.11   Sodium, Serum 145   Chloride, Serum 107   CO2, Serum 31   Anion Gap 7   Osmolality (calc) 312   eGFR (African American) >60   eGFR (Non-African American)  58 (eGFR values <63m/min/1.73 m2 may be an indication of chronic kidney disease (CKD). Calculated eGFR is useful in patients with stable renal function. The eGFR calculation will not be reliable in acutely ill patients when serum creatinine is changing rapidly. It is not useful in  patients on dialysis. The eGFR calculation may not be applicable to patients at the low and high extremes of body sizes, pregnant women, and vegetarians.)   Result Comment  LABS - This specimen was collected through an   - indwelling catheter or arterial line.  - A minimum of 521m of blood was wasted prior    - to collecting the sample.  Interpret  - results with caution.  Result(s) reported on 16 Jun 2012 at 04:43AM.   Result Comment CALCIUM/POTASSIUM - DUPLICATE TESTS: CANCELLED...TPL  Result(s) reported on 16 Jun 2012 at 04:42AM.   Potassium, Serum -   Phosphorus, Serum 4.0 (Result(s) reported on 16 Jun 2012 at 04:59AM.)   Magnesium, Serum 1.9 (1.8-2.4 THERAPEUTIC RANGE: 4-7 mg/dL TOXIC: > 10 mg/dL  -----------------------)   Calcium (Total), Serum -  Routine Coag:  12-Jul-13 06:28    INR 0.9 (INR reference interval applies to patients on anticoagulant therapy. A single INR therapeutic range for coumarins is not optimal for all indications; however, the suggested range for most indications is 2.0 -  3.0. Exceptions to the INR Reference Range may include: Prosthetic heart valves, acute myocardial infarction, prevention of myocardial infarction, and combinations of aspirin and anticoagulant. The need for a higher or lower target INR must be assessed individually. Reference: The Pharmacology and Management of the Vitamin K  antagonists: the seventh ACCP Conference on Antithrombotic and Thrombolytic Therapy. UPBDH.7897 Sept:126 (3suppl): N9146842. A HCT value >55% may artifactually increase the PT.  In one study,  the increase was an average of 25%. Reference:  "Effect on Routine and Special Coagulation Testing Values of Citrate Anticoagulant Adjustment in Patients with High HCT Values." American Journal of Clinical Pathology 2006;126:400-405.)  Routine Hem:  15-Jul-13 04:25    WBC (CBC) 8.3   RBC (CBC) 4.11   Hemoglobin (CBC)  11.9   Hematocrit (CBC) 36.5   Platelet Count (CBC) 212   MCV 89   MCH 29.1   MCHC 32.7   RDW  15.1   Neutrophil % 65.5   Lymphocyte % 17.9   Monocyte % 12.9   Eosinophil % 3.2   Basophil % 0.5   Neutrophil # 5.5    Lymphocyte # 1.5   Monocyte #  1.1   Eosinophil # 0.3   Basophil # 0.0 (Result(s) reported on 16 Jun 2012 at 05:02AM.)   Assessment/Plan:  Assessment/Plan:   Assessment 1) s/p severe cva secondary to HTN 2) dysphagia, inability to maintain/fill caloric needs, via peroral feeding.    Plan 1) family has changed  minds about PEG.  As such will arrange for tomorrow with  Dr Leanora Cover.  I have discussed the risks benefits and complications of PEG and EGD to include not limited to bleeding infection perforation and sedation and family wish to proceed.   Electronic Signatures: Loistine Simas (MD)  (Signed 15-Jul-13 20:51)  Authored: Chief Complaint, VITAL SIGNS/ANCILLARY NOTES, Brief Assessment, Lab Results, Assessment/Plan   Last Updated: 15-Jul-13 20:51 by Loistine Simas (MD)

## 2015-03-22 NOTE — Discharge Summary (Signed)
PATIENT NAMELOWEN, [name] MR#:  [id number] DATE OF BIRTH:  [birth year]  DATE OF ADMISSION:  06/05/2012 DATE OF DISCHARGE:  07/01/2012  THIS DISCHARGE SUMMARY COVERS THE PATIENT'S HOSPITAL COURSE FROM 06/28/2012 TO 07/01/2012. FOR ADDITIONAL DETAILS PLEASE Mandy THE DISCHARGE SUMMARIES DICTATED BY OTHER PHYSICIANS. THE LAST ONE WAS DONE BY DR. Chana Bode PATEL ON 06/27/2012.   DIAGNOSES:  1. Systemic inflammatory response syndrome with fever, leukocytosis due to infection around the percutaneous endoscopic gastrostomy tube.  2. Acute bilateral cerebral, pontine and left cerebral stroke.  3. Malignant hypertension. 4. Diabetes. 5. Moderate malnutrition.  6. Hypokalemia.  7. Hypomagnesemia.  8. Hyperlipidemia.  9. Elevated troponin due to demand ischemia.  10. Elevated d-dimer with no pulmonary embolus on computed tomography chest.  11. Dysphagia status post percutaneous endoscopic gastrostomy tube. 12. Urinary retention.   DISPOSITION: Patient is being discharged to skilled nursing facility.   ACTIVITY: As tolerated.   FOLLOW UP: Follow up with primary care physician upon discharge.   DIET: Jevity 1.5 cal, 300 mL at 8:00 a.m., 12:00 a.m., 1600 p.m. and 20:00 p.m. Flush with free water 30 mL before and after feeding.   DRESSING CARE: Apply bacitracin to the PEG site area and change the dressing b.i.d. and p.r.n.    DISCHARGE MEDICATIONS:  1. Flagyl 500 mg q.8 hours for seven days. 2. Doxycycline 150 mg q.12 hours for seven days.  3. Tylenol 650 mg q.4 hours as needed.  4. Tylenol/hydrocodone 325/5 mg 1 tablet q.4 hours p.r.n.  5. Lisinopril 40 mg daily.  6. Clonidine patch 0.3 q. weekly. 7. NovoLog insulin sliding scale with Accu-Cheks q.6 hours. 8. Insulin detemir 8 units subcutaneously at bedtime.  9. Simvastatin 40 mg daily.  10. Aspirin 81 mg daily.  11. Olanzapine 2.5 mg b.i.d.  12. Olanzapine 5 mg at bedtime.  13. Lopressor 100 mg b.i.d.  14. Amlodipine 10 mg daily.   15. Bacitracin apply to affected area b.i.d.  16. Bisacodyl 10 mg suppository once a day as needed for constipation.  17. Magnesium oxide 400 mg daily.   CONSULTATIONS:  1. Surgical consultation with Dr. Marina Gravel. 2. ID consultation with Dr. Clayborn Bigness. 3. Neurology consultation with Dr. Manuella Ghazi.  4. Palliative care consultation with Dr. Ermalinda Memos.   LABORATORY, DIAGNOSTIC, AND RADIOLOGICAL DATA: CT of the abdomen done on 06/28/2012 showed gas remains within the subcutaneous and deeper soft tissues of the anterior abdominal wall adjacent to the PEG insertion site. There is soft tissue density. There is a decrease in the volume of gas present in the density of the fat has increased suggesting progressive inflammation. No discrete drainable abscess is demonstrated. No evidence of ileus or bowel obstruction. Patient's white count went from 19.3 to 11.5, platelet count 511 to 844, hemoglobin 13.6. BMP was normal.   HOSPITAL COURSE: This discharge summary covers the hospital course from 06/28/2012 to 07/01/2012. For additional details please Jerilyn interim discharge summaries done by other physicians.  1. SIRS with high fever. Patient had developed high fever with leukocytosis. She was found to have cellulitis around the PEG with no drainable abscess as per CT done on 06/27/2012. She was initially treated with vancomycin and Diflucan which was started on 07/18 and discontinued on 06/27/2012. An ID consultation was obtained and patient was started on ertapenem which was subsequently discontinued on 06/30/2012 once she was started on oral antibiotics. The surgeon drained some fluid from around the PEG tube with some improvement in the cellulitis. She does continue to have drainage. A repeat  CAT scan was done on 06/28/2012 which did not show any evidence of abscess. The surgeon felt that the area was overall improved and no further intervention was required. Patient was switched to oral antibiotics and she will continue an  outpatient course of oral Flagyl and doxycycline based on Dr. Fabio Asa recommendations. Her white count has significantly improved. She has had no further fevers. 2. Acute bilateral cerebral, pontine and left cerebral stroke. Patient is on aspirin and Zocor. Her blood pressure is overall well controlled. Her ESR and ANA were negative. An  ANCA and hypercoagulable work-up was negative other than mildly elevated lupus anticoagulant. She will need to have repeat lupus anticoagulant checked in about 12 weeks to definitely confirm diagnosis of hypercoaguable state . She was seen by Dr. Manuella Ghazi during the hospitalization who did not feel like she had vasculitis. He did not recommend any immunosuppressive therapy. All steroids were held.  3. Malignant hypertension on presentation. Patient had very tight blood pressure control on Lopressor, clonidine patch, lisinopril and Norvasc and in fact was having borderline low blood pressure therefore the dose of Lopressor was lowered which resulted in good hypertensive control. 4. Diabetes. She is on Levemir and insulin sliding scale with good control.  5. Moderate malnutrition. She is on currently PEG tube feeds due to dysphagia.  6. Low potassium and magnesium, have been supplemented. 7. Hyperlipidemia. Her LDL was elevated. She is on statin therapy.  8. Elevated troponin. This was felt to be due to demand ischemia from malignant hypertension.  9. Elevated d-dimer, however, her CT chest was negative for any PE.  10. She is being discharged to a skilled nursing facility in stable condition, however, her overall prognosis is guarded.   TIME SPENT: 45 minutes.   ____________________________ Cherre Huger, MD sp:cms D: 07/01/2012 11:49:36 ET T: 07/01/2012 12:06:53 ET JOB#: [id number]  cc: Cherre Huger, MD, <Dictator> Cherre Huger MD ELECTRONICALLY SIGNED 07/02/2012 12:18

## 2015-03-22 NOTE — Op Note (Signed)
PATIENT NAMRalene Cowan:  Strege, Jayni MR#:  [id number] DATE OF BIRTH:  1961/09/05  DATE OF PROCEDURE:  06/17/2012  PREOPERATIVE DIAGNOSIS: Dysphagia.   POSTOPERATIVE DIAGNOSIS: Dysphagia.   OPERATION PERFORMED: Percutaneous endoscopic gastrostomy tube  placement.   CO-SURGEON: Claude MangesWilliam F. Kerrilyn Azbill, MD   CO-SURGEON: Barnetta ChapelMartin Skulskie, MD .   PROCEDURE IN DETAIL: After Dr. Reyes IvanSkulskie's EGD, he transilluminated the anterior body of the stomach onto the anterior abdominal wall, and I placed an IV percutaneously into the stomach and placed a guidewire through this which he grasped and brought out through the mouth. I connected it to a pull-type PEG tube device and drew it back down through the esophagus and the stomach and out through the anterior abdominal wall. The top of the bolster lie at 3.5 cm. Dr. Marva PandaSkulskie re-evaluated the PEG and saw that the bolster was in good position. I then secured. The bolster to the skin with 2-0 silk sutures and further secured the bolster to the tube itself to keep the tube from slipping and connected the end pieces to the gastrostomy tube, completing the procedure. The patient tolerated the procedure well. There were no complications.   ____________________________ Claude MangesWilliam F. Arijana Narayan, MD wfm:cbb D: 06/17/2012 18:24:12 ET T: 06/18/2012 09:34:40 ET JOB#: [id number]  cc: Claude MangesWilliam F. Marcellina Jonsson, MD, <Dictator> Christena DeemMartin U. Skulskie, MD Claude MangesWILLIAM F Loralie Malta MD ELECTRONICALLY SIGNED 06/18/2012 10:00

## 2015-03-22 NOTE — Consult Note (Signed)
PATIENT Ashley, Cowan MR#:  [id number] DATE OF BIRTH:  [birth year]  DATE OF CONSULTATION:  06/20/2012  REFERRING PHYSICIAN:  Dr. Renae Gloss  CONSULTING PHYSICIAN:  Rosalyn Gess. Allison Deshotels, MD  REASON FOR CONSULTATION: Fever.   HISTORY OF PRESENT ILLNESS: Patient is a 54 year old [place] female with a past history significant for malignant hypertension and prior stroke who was admitted on 07/04 with aphasia. Her work-up demonstrated that she was also having severe hypertensive crisis. She was initially placed in the Critical Care Unit and her blood pressure was brought under control. Evaluation has determined that she had another stroke. She was having difficulty with speaking and swallowing. She has residual left-sided weakness from her prior stroke. She was receiving TPN and she underwent PEG tube placement on 07/16. Following that, she began having initially low-grade fevers and in the last 24 to 48 hours she has had significantly higher fevers with temperatures up to 103.0. Prior to 07/16 most of her temperatures were below 100 although on 07/12 she had some occasional temperature elevations that remained less than 101. On admission she had a urinalysis that showed 1+ ketones, 3+ blood, negative nitrites, trace leukocyte esterase with 469 red cells and 21 white cells. A urine culture grew a minimal amount of coagulase negative staph. Blood cultures from 07/18 are pending. A urinalysis from 07/18 now shows negative blood, positive nitrites, 3+ leukocyte esterase, 15 red cells and 191 white cells. Urine culture too small to read. She had been receiving TPN prior to the PEG tube placement and her triple lumen has been removed and the catheter tip from 07/18 is currently pending. She is unable to provide any history because of her stroke. She was interviewed with the assistance of her son who translated and she does appear to be having some discomfort but it appears to be at the IV site in her right hand.  Further communication was limited by her aphasia and language barrier.   ALLERGIES: None.   PAST MEDICAL HISTORY:  1. Hypertension.  2. Stroke.   SOCIAL HISTORY: She lives with her husband and three children. She does not smoke. She does not drink. She is originally from [place].   FAMILY HISTORY: Positive for stroke.   REVIEW OF SYSTEMS: Unable to obtain from the patient due to her aphasia.   PHYSICAL EXAMINATION:  VITAL SIGNS: T-max 103.0, T-current 99.0, pulse 97, blood pressure 124/79, 91% on room air.   GENERAL: 54 year old [place] female in no acute distress.   HEENT: Normocephalic, atraumatic. Pupils equal, reactive to light. Extraocular motion intact. Sclerae, conjunctivae, and lids are without evidence for emboli or petechiae. Oropharynx shows no erythema or exudate. Gums are in fair condition.   NECK: Supple. Full range of motion. Midline trachea. No lymphadenopathy. No thyromegaly.   CHEST: Clear to auscultation bilaterally with good air movement. No focal consolidation.   CARDIAC: Regular rate and rhythm without murmur, rub, or gallop.   ABDOMEN: Soft, mildly tender at the PEG tube placement site but no rebound or guarding. No hernias were noted.   EXTREMITIES: She had some swelling in the left upper extremity. There is no evidence for tenosynovitis.   SKIN: No rashes. No stigmata of endocarditis, specifically no right Janeway lesions nor Osler nodes.   NEUROLOGIC: The patient was awake but aphasic. She had limited ability to communicate with grunts. Her left hand was held in a flexure posture and she was not moving her left leg. She had seemingly purposeless movement of the right hand  with occasionally pulling the sheets and at other times pushing them away. She would make eye contact, however.   PSYCHIATRIC: Unable to assess due to her neurologic condition.  LABORATORY, DIAGNOSTIC AND RADIOLOGICAL DATA: BUN 33, creatinine 1.22, bicarbonate 30, anion gap 10. LFTs  earlier in her hospitalization were unremarkable. White count 22.9 with hemoglobin 12.4, platelet count 233, ANC 17.0. Her white count on admission was 8.9 and did go up to 20.8 on 07/18 from 8.3 on 07/15. Blood cultures from 07/18 show no growth to date. Catheter tip culture from 07/18 shows no growth to date. A urine culture from 07/18 shows colonies too small to read. Urine culture from admission shows small amount of coagulase-negative staph. Urinalysis are as described in the history and physical. A TEE from 07/11 showed no thrombus or vegetation noted. A CT of the head without contrast from 07/04 showed progression of white matter density changes consistent with chronic small vessel ischemia. There is no hemorrhage noted. Chest x-ray from 07/04 showed no areas of infiltrate. CT of the chest on 07/04 with contrast with no evidence for emboli or infiltrates. Bilateral carotid Doppler's showed no evidence for stenosis but was a limited exam. Repeat CT scan without contrast from 07/06 showed a new area in white matter adjacent to the right frontal horn of the lateral ventricle. There was a subacute versus chronic right thalamic lacunar infarct. CT angiogram from 07/08 shows findings worrisome for diffuse cerebral vasculitis. CT scan of the head without contrast from 07/09 showed an 8 mm hyperdensity in the left parietal lobe concerning for a parenchymal hemorrhage that was new. MRI of the brain from 07/09 showed a large pontine infarct that was acute and bilateral cerebral and left cerebellar acute infarcts. Chest x-ray from 0710 showed no infiltrates. A chest x-ray from 07/18 showed some mild interstitial edema.   IMPRESSION: 54 year old [place] woman with history of malignant hypertension and stroke admitted with stroke who has developed fever while in house.   RECOMMENDATIONS:  1. She is unable to provide significant symptom history. She has been afebrile with occasional temperature around 100 until she  underwent PEG tube placement. At that time she had a central line in place and was receiving TPN. The TPN has been stopped and her line removed. Blood and catheter tip cultures are pending. Her urinalysis shows some inflammation and a urine culture is pending.   2. Given the central line and TPN I agree with covering for resistant gram-positive cocci and yeast. Given the urine inflammation gram-negative coverage is also needed.  3. She had been started on vancomycin and ceftriaxone and fluconazole. These will provide adequate coverage for the organisms of concern.  4. Will await her culture results.  5. If she continues to spike greater than 101 would change ceftriaxone to either Zosyn or meropenem to cover Pseudomonas until the cultures are available.  6. Her brain imaging seems to indicate progressive infarcts and possible vasculitis. It is possible that in this setting central fevers have developed. If vasculitis is a serious concern then the use of steroids or other immunomodulating agents would be appropriate.    This is a moderately complex infectious disease case. Thank you very much for involving me in Ms. Arduini's care.   ____________________________ Rosalyn GessMichael E. Sha Burling, MD meb:cms D: 06/20/2012 15:51:32 ET T: 06/20/2012 16:22:02 ET JOB#: [id number]  cc: Rosalyn GessMichael E. Nashayla Telleria, MD, <Dictator> Jahmil Macleod E Lillyann Ahart MD ELECTRONICALLY SIGNED 06/23/2012 8:31

## 2015-03-22 NOTE — Consult Note (Signed)
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   Patient was seen by consultant.   Consult note dictated.   Comments: Pt was initially seen by Dr. Reita Cliche and then followed by Dr. 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 - Interim - she had significant infection related issues - which can cause reactive vasculities picture but her strokes were earlier than her infection issue. - Even if we decide to call this vasculitis - she will be a logistically poor candidate for - high dose steroids OR immunosuppressive treatment (considering she was not compliant with anti-hypertesive, and anti-platelet regimen) - will inform Dr. Loletta Specter and he will resume following her.  Electronic Signatures: Ray Church (MD)  (Signed 21-Jul-13 16:52)  Authored: Brief Consult Note   Last Updated: 21-Jul-13 16:52 by Ray Church (MD)

## 2015-03-22 NOTE — Consult Note (Signed)
Impression: 54yo [place] woman w/ h/o malignant HTN and CVA admitted with CVA who developed fever while in house.  She is unable to provide significant symptoms history.  She had been afebrile with occasional temps around 100 until she underwent PEG tube placement.  At that time, she had a central line in place and was receiving TPN.  TPN has been stopped and her line removed.  Blood and cath tip cultures are pending.  Her u/a shows some inflammation and a UCx is pending. Given the central line and TPN, I agree covering for resistant GPC and yeast.  Given the urine inflammation, GNR coverage is also needed. She has been started on vanco, ceftriaxone and fluconazole.  These will provide adequate coverage. Will await her culture results.   If she continues to spike >101, would change the ceftriaxone to either zosyn or meropenem to cover Pseudomonas. Her brain imaging seems to indicate progressive infarcts and possible vasculitis.  It is possible that in this setting central fevers have developed.  If vasculitis is a serious concern, then the use of steroids or other immunomodulating agents would be appropriate.   Electronic Signatures: Aubria Vanecek, Rosalyn GessMichael E (MD) (Signed on 19-Jul-13 15:36)  Authored   Last Updated: 19-Jul-13 15:51 by Kayah Hecker, Rosalyn GessMichael E (MD)

## 2015-03-22 NOTE — Consult Note (Signed)
Chief Complaint:   Subjective/Chief Complaint minimally responsive to examiner, won't open eyes.   no apparent discomfort   VITAL SIGNS/ANCILLARY NOTES: **Vital Signs.:   16-Jul-13 12:14   Vital Signs Type Routine   Temperature Temperature (F) 99.7   Celsius 37.6   Temperature Source axillary   Pulse Pulse 112   Respirations Respirations 18   Systolic BP Systolic BP 145   Diastolic BP (mmHg) Diastolic BP (mmHg) 57   Mean BP 86   Pulse Ox % Pulse Ox % 95   Pulse Ox Activity Level  At rest   Oxygen Delivery Room Air/ 21 %   Brief Assessment:   Cardiac Regular    Respiratory clear BS    Gastrointestinal details normal Soft  Nontender  Nondistended  No masses palpable  Bowel sounds normal   Lab Results: Hepatic:  16-Jul-13 04:31    Albumin, Serum  2.5 (Result(s) reported on 17 Jun 2012 at 04:49AM.)  Routine Chem:  16-Jul-13 04:31    Potassium, Serum 3.7 (Result(s) reported on 17 Jun 2012 at 04:49AM.)   Magnesium, Serum 1.8 (1.8-2.4 THERAPEUTIC RANGE: 4-7 mg/dL TOXIC: > 10 mg/dL  -----------------------)   Calcium (Total), Serum 8.9 (Result(s) reported on 17 Jun 2012 at 04:49AM.)   Phosphorus, Serum 3.6 (Result(s) reported on 17 Jun 2012 at 04:49AM.)  Routine Hem:  15-Jul-13 04:25    Platelet Count (CBC) 212   Assessment/Plan:  Assessment/Plan:   Assessment 1) cva/ hypertensive emergency. 2) failure to feed/dysphagia    Plan 1) for egd/peg today.  further recs to follow.   Electronic Signatures: Barnetta ChapelSkulskie, Martin (MD)  (Signed 16-Jul-13 12:36)  Authored: Chief Complaint, VITAL SIGNS/ANCILLARY NOTES, Brief Assessment, Lab Results, Assessment/Plan   Last Updated: 16-Jul-13 12:36 by Barnetta ChapelSkulskie, Martin (MD)

## 2015-03-27 NOTE — Consult Note (Signed)
Chief Complaint:   Subjective/Chief Complaint will indicate yes/no answers.  no abdominal pain or nausea.  can move right upper and lower extremity to command, lue contracted, no purposeful movement lle.   VITAL SIGNS/ANCILLARY NOTES: **Vital Signs.:   09-Jul-13 13:10   Vital Signs Type Post Medication   Systolic BP Systolic BP 170   Diastolic BP (mmHg) Diastolic BP (mmHg) 94   Mean BP 119    13:12   Vital Signs Type POCT   Nurse Fingerstick (mg/dL) FSBS (fasting range 96-0465-99 mg/dL) [id number]   Comments/Interventions  Nurse Notified   Brief Assessment:   Cardiac Regular    Respiratory clear BS    Gastrointestinal details normal Soft  Nontender  Nondistended  No masses palpable  Bowel sounds normal   Assessment/Plan:  Assessment/Plan:   Assessment 1) s/p acute CVA, in the setting of hypertensive emergency.   2) dysphagia-now with NGT    Plan 1) agree with PEG when clinically feasible, timing the question.  recommend better control of htn prior to sedated proceedure, need further neurology input as to management, prognosis.  following.  please note abd film indicates possible loose "knotting" of dubhoff tube,  recommend recheck of 2 way abd film to check this.   Electronic Signatures: Barnetta ChapelSkulskie, Martin (MD)  (Signed 09-Jul-13 18:05)  Authored: Chief Complaint, VITAL SIGNS/ANCILLARY NOTES, Brief Assessment, Assessment/Plan   Last Updated: 09-Jul-13 18:05 by Barnetta ChapelSkulskie, Martin (MD)

## 2015-03-27 NOTE — Consult Note (Signed)
General Aspect Lack of IV access    Present Illness The patient is a 54 year old female who presented to the ER several days ago with a chief complaint of aphasia. The patient has a history of prior CVA in 2008 and hypertension which has been uncontrolled. Following her last admission in 2008, the patient has been noncompliant with follow-up with a primary care provider and does not take any medications currently. She presented with expressive aphasia worsening and  exacerbation of her left upper extremity weakness. In the ER her systolic blood pressure was greater than 220. CAT scan shows lacunar infarction in the right thalamic nucleus as well as a small lacunar infarction in the left caudate nucleus.  She is continueing to require multiple parenteral medications and IV fluids since she is unable to chew or swallow.  I am asked to evaluate for access.   PAST MEDICAL HISTORY:  1. History of cerebrovascular accident in 2008.  2. Hypertension, presented in hypertensive emergency. 3.           Tubal ligation    No Known Allergies:   Case History:   Family History Non-Contributory    Social History negative tobacco, negative ETOH, negative Illicit drugs   Review of Systems:   ROS Pt not able to provide ROS   Physical Exam:   GEN well developed, well nourished    HEENT dry oral mucosa, poor dentition    NECK supple  trachea midline    RESP normal resp effort  no use of accessory muscles    CARD regular rate  no JVD    ABD denies tenderness  soft  nondistended    EXTR negative cyanosis/clubbing, negative edema, left dorsal foot IV    SKIN normal to palpation, No rashes, No ulcers    NEURO L side weakness, aphasic, does not follow commands    PSYCH poor insight, lethargic   Nursing/Ancillary Notes: **Vital Signs.:   10-Jul-13 08:00   Vital Signs Type Routine   Temperature Temperature (F) 98.2   Celsius 36.7   Pulse Pulse 133   Respirations Respirations 18   Systolic BP  Systolic BP 324   Diastolic BP (mmHg) Diastolic BP (mmHg) 96   Mean BP 117   Pulse Ox % Pulse Ox % 99   Pulse Ox Activity Level  At rest   Oxygen Delivery Room Air/ 21 %   Routine Chem:  06-Jul-13 06:30    Magnesium, Serum 1.9 (1.8-2.4 THERAPEUTIC RANGE: 4-7 mg/dL TOXIC: > 10 mg/dL  -----------------------)   Creatinine (comp) 0.76   eGFR (African American) >60   eGFR (Non-African American) >60 (eGFR values <65m/min/1.73 m2 may be an indication of chronic kidney disease (CKD). Calculated eGFR is useful in patients with stable renal function. The eGFR calculation will not be reliable in acutely ill patients when serum creatinine is changing rapidly. It is not useful in  patients on dialysis. The eGFR calculation may not be applicable to patients at the low and high extremes of body sizes, pregnant women, and vegetarians.)   Glucose, Serum  189   BUN 8   Sodium, Serum 138   Potassium, Serum 3.5   Chloride, Serum 104   CO2, Serum 24   Calcium (Total), Serum 8.5   Anion Gap 10   Osmolality (calc) 279   Hemoglobin A1c ([facility])  8.9 (The American Diabetes Association recommends that a primary goal of therapy should be <7% and that physicians should reevaluate the treatment regimen in patients with  HbA1c values consistently >8%.)  10-Jul-13 04:48    Magnesium, Serum  1.4 (1.8-2.4 THERAPEUTIC RANGE: 4-7 mg/dL TOXIC: > 10 mg/dL  -----------------------)   Phosphorus, Serum 3.1 (Result(s) reported on 11 Jun 2012 at [facility].)   Creatinine (comp) 0.87   eGFR (African American) >60   eGFR (Non-African American) >60 (eGFR values <72m/min/1.73 m2 may be an indication of chronic kidney disease (CKD). Calculated eGFR is useful in patients with stable renal function. The eGFR calculation will not be reliable in acutely ill patients when serum creatinine is changing rapidly. It is not useful in  patients on dialysis. The eGFR calculation may not be applicable to patients at the low and  high extremes of body sizes, pregnant women, and vegetarians.)     Impression 1. Lack of IV access                   will place central line with UKoreaguidance, right IJ 2. Acute CVA/lacunar infarction.                    Neurology following                   prognosis for significant recovery is poor 3. Hypokalemia.                    repleted  4. Hypertensive crisis.                    antihypertensives per ESadie HaberDoc    Plan level 3   Electronic Signatures: SHortencia Pilar(MD)  (Signed 10-Jul-13 15:22)  Authored: General Aspect/Present Illness, Allergies, History and Physical Exam, Vital Signs, Labs, Impression/Plan   Last Updated: 10-Jul-13 15:22 by SHortencia Pilar(MD)

## 2015-03-27 NOTE — Consult Note (Signed)
Chief Complaint:   Subjective/Chief Complaint more alert earlier today per sitter.  minimal response to me this evening. no apparent pain or discomfort.   VITAL SIGNS/ANCILLARY NOTES: **Vital Signs.:   12-Jul-13 15:31   Vital Signs Type Routine   Temperature Temperature (F) 98.7   Celsius 37   Temperature Source tympanic   Pulse Pulse 117   Respirations Respirations 20   Systolic BP Systolic BP 263   Diastolic BP (mmHg) Diastolic BP (mmHg) 93   Mean BP 120   Pulse Ox % Pulse Ox % 96   Pulse Ox Activity Level  At rest   Oxygen Delivery when off  off o2   Brief Assessment:   Cardiac Regular    Respiratory clear BS    Gastrointestinal details normal Soft  Nontender  Nondistended  No masses palpable  Bowel sounds normal   Lab Results: Hepatic:  12-Jul-13 06:28    SGOT (AST) 28 (Result(s) reported on 13 Jun 2012 at 07:33AM.)   Alkaline Phosphatase 56 (Result(s) reported on 13 Jun 2012 at 07:33AM.)   Albumin, Serum  2.8 (Result(s) reported on 13 Jun 2012 at 07:33AM.)   Total Protein, Serum 6.8 (Result(s) reported on 13 Jun 2012 at 07:33AM.)  General Ref:  12-Jul-13 06:28    Prealbumin PREALBUMIN   Result(s) reported on 13 Jun 2012 at 07:33AM.  Routine Chem:  12-Jul-13 06:28    Phosphorus, Serum  2.1 (Result(s) reported on 13 Jun 2012 at 07:33AM.)   Magnesium, Serum  1.6 (1.8-2.4 THERAPEUTIC RANGE: 4-7 mg/dL TOXIC: > 10 mg/dL  -----------------------)   Cholesterol, Serum 160 (Result(s) reported on 13 Jun 2012 at 07:33AM.)   Triglycerides, Serum 110 (Result(s) reported on 13 Jun 2012 at 07:33AM.)   Glucose, Serum  232   BUN 12   Creatinine (comp) 0.83   Sodium, Serum 144   Potassium, Serum  3.0   Chloride, Serum  112   CO2, Serum 22   Anion Gap 10   Osmolality (calc) 294   Calcium (Total), Serum  8.4   eGFR (African American) >60   eGFR (Non-African American) >60 (eGFR values <44m/min/1.73 m2 may be an indication of chronic kidney disease (CKD). Calculated eGFR  is useful in patients with stable renal function. The eGFR calculation will not be reliable in acutely ill patients when serum creatinine is changing rapidly. It is not useful in  patients on dialysis. The eGFR calculation may not be applicable to patients at the low and high extremes of body sizes, pregnant women, and vegetarians.)  Routine Coag:  12-Jul-13 06:28    Prothrombin 12.2   INR 0.9 (INR reference interval applies to patients on anticoagulant therapy. A single INR therapeutic range for coumarins is not optimal for all indications; however, the suggested range for most indications is 2.0 - 3.0. Exceptions to the INR Reference Range may include: Prosthetic heart valves, acute myocardial infarction, prevention of myocardial infarction, and combinations of aspirin and anticoagulant. The need for a higher or lower target INR must be assessed individually. Reference: The Pharmacology and Management of the Vitamin K  antagonists: the seventh ACCP Conference on Antithrombotic and Thrombolytic Therapy. CZCHYI.5027Sept:126 (3suppl): [id number] A HCT value >55% may artifactually increase the PT.  In one study,  the increase was an average of 25%. Reference:  "Effect on Routine and Special Coagulation Testing Values of Citrate Anticoagulant Adjustment in Patients with High HCT Values." American Journal of Clinical Pathology 2006;126:400-405.)   Activated PTT (APTT) 35.2 (A HCT value >55% may  artifactually increase the APTT. In one study, the increase was an average of 19%. Reference: "Effect on Routine and Special Coagulation Testing Values of Citrate Anticoagulant Adjustment in Patients with High HCT Values." American Journal of Clinical Pathology 2006;126:400-405.)  Routine Hem:  12-Jul-13 06:28    WBC (CBC)  13.9 (Result(s) reported on 13 Jun 2012 at 07:33AM.)   Hemoglobin (CBC) 13.1 (Result(s) reported on 13 Jun 2012 at 07:33AM.)   Platelet Count (CBC) 216 (Result(s) reported on  13 Jun 2012 at 07:33AM.)   Assessment/Plan:  Assessment/Plan:   Assessment 1) dysphagia s/p cva. failed swallowing test today.  Seen this afternoon by surgery to assist with peg placement.  will arrange for monday. Surgeon has arranges to discuss further with family tomorrow am.   Electronic Signatures: Loistine Simas (MD)  (Signed 12-Jul-13 18:27)  Authored: Chief Complaint, VITAL SIGNS/ANCILLARY NOTES, Brief Assessment, Lab Results, Assessment/Plan   Last Updated: 12-Jul-13 18:27 by Loistine Simas (MD)

## 2015-03-27 NOTE — Consult Note (Signed)
Brief Consult Note: Diagnosis: CVA, inability to swallow.   Comments: Available to assist with PEG.  Just call me [phone].  Electronic Signatures: Claude MangesMarterre, Ovid Witman F (MD)  (Signed 12-Jul-13 15:13)  Authored: Brief Consult Note   Last Updated: 12-Jul-13 15:13 by Claude MangesMarterre, Murle Hellstrom F (MD)

## 2015-03-27 NOTE — Consult Note (Signed)
    Comments   I spoke with pt's son, [name]. He is aware that pt failed MBSS today. He says that he agrees with PEG if there is no other solution. He asks that pt's significant other be involved in decision and that procedure be explained to significant other as well. Spoke with Dr Anda KraftMarterre who will be assisting with PEG. He will meet with family in AM for meeting. We have arranged for interpreter to be present. I informed son, [name], of meeting.  spent: 20 minutes  Electronic Signatures: Jag Lenz, Harriett SineNancy (MD)  (Signed 12-Jul-13 16:25)  Authored: Palliative Care   Last Updated: 12-Jul-13 16:25 by Aloni Chuang, Harriett SineNancy (MD)

## 2015-03-27 NOTE — Consult Note (Signed)
Chief Complaint:   Subjective/Chief Complaint patietn had TEE today with sedation, not as responsive today,?sedation.  Husband at bedside says no change from yesterday.   VITAL SIGNS/ANCILLARY NOTES: **Vital Signs.:   11-Jul-13 09:42   Vital Signs Type Post-Procedure   Temperature Temperature (F) 97.6   Celsius 36.4   Temperature Source tympanic   Pulse Pulse 103   Pulse source if not from Vital Sign Device per cardiac monitor   Respirations Respirations 24   Systolic BP Systolic BP 122   Diastolic BP (mmHg) Diastolic BP (mmHg) 77   Mean BP 92   Pulse Ox % Pulse Ox % 100   Pulse Ox Activity Level  At rest   Oxygen Delivery 3L   Brief Assessment:   Cardiac Regular    Respiratory clear BS    Gastrointestinal details normal Soft  Nontender  Nondistended  No masses palpable  Bowel sounds normal   Lab Results: Cardiology:  11-Jul-13 08:54    TEE  Interpretation Summary   No cardiac source of CVA noted No thrombus or vegetation noted.  Mild  atherosclerotic plaque(s) in the ascending aorta. Mild to moderate  atherosclerotic plaque(s) in the descending aorta. The left  ventricular ejection fraction is normal. Ejection Fraction = >55%.  The right ventricular systolic function is normal. No left atrial  mass or thrombus visualized.  Left Ventricle   The left ventricular ejection fraction is normal.   Ejection Fraction = >55%.   The left ventricular wall motion is normal.   The left ventricle is normal in size.   There is no thrombus.   There is normal left ventricular wall thickness.  Right Ventricle   The right ventricle is normal size.   The right ventricular systolic function is normal.  Atria   The left atrial size is normal.   There is no Doppler evidence for an atrial septal defect.   Injection of contrast documented no interatrial shunt.   No thrombus is detected in the left atrial appendage.   No left atrial mass or thrombus visualized.   Right atrial size is  normal.  Mitral Valve   The mitral valve leaflets appear normal. There is no evidence of  stenosis, fluttering, or prolapse.   There is no mitral valve stenosis.   There is trace mitral regurgitation.  Tricuspid Valve   The tricuspid valve is normal.   There is trace tricuspid regurgitation.  Aortic Valve   The aortic valve is trileaflet.   The aortic valve opens well.   No aortic regurgitation is present.   No hemodynamically significant valvular aortic stenosis.  Pulmonic Valve   The pulmonic valve is not well seen, but is grossly normal.  Great Vessels   The aortic root is normal size.   Mild atherosclerotic plaque(s) in the ascending aorta.   Mild atherosclerotic plaque(s) in the descending aorta.  Pericardium/Pleural   There is no pleural effusion.   No pericardial effusion.  Reading Physician: Julien NordmannGollan, Timothy  Sonographer: Gollan,Timothy,MD Interpreting Physician:  Julien Nordmannimothy Gollan,  electronically signed on  06-12-2012 13:25:51 Requesting Physician: Julien NordmannGollan, Timothy  Routine Chem:  11-Jul-13 04:43    Potassium, Serum  3.4 (Result(s) reported on 12 Jun 2012 at 12:46PM.)   Phosphorus, Serum 3.2 (Result(s) reported on 12 Jun 2012 at 12:46PM.)   Calcium (Total), Serum  8.4 (Result(s) reported on 12 Jun 2012 at 12:46PM.)   Magnesium, Serum 1.9 (1.8-2.4 THERAPEUTIC RANGE: 4-7 mg/dL TOXIC: > 10 mg/dL  -----------------------)   Assessment/Plan:  Assessment/Plan:  Assessment 1) CVA-hypertensice emergency.  2) possible dysphagia-awaiting repeat swallowing trial arranged for tomorrow am.  if poor will pursue PEG.    Plan as noted.   Electronic Signatures: Barnetta Chapel (MD)  (Signed 11-Jul-13 13:47)  Authored: Chief Complaint, VITAL SIGNS/ANCILLARY NOTES, Brief Assessment, Lab Results, Assessment/Plan   Last Updated: 11-Jul-13 13:47 by Barnetta Chapel (MD)

## 2015-03-27 NOTE — Consult Note (Signed)
Chief Complaint:   Subjective/Chief Complaint Patient seen and limited examination done.  Patient is not responsive to verbal stimulation even with her boyfrient.  There are no family memebers in the room to provide translation ability and the translator line is not functioning.  Asked to Maya patient to evaluate for PEG.  Case discussed with Speech pathology.  apparently she did a bedside evaluation on friday without difficult, or minimal difficulty, and on attempt  of MBSS today, she was unable to cooperate at all.  She has become very agitated today, and several hours ago had been given some Morphine for this agitation.  Currently not responsive to stimulation, will try to turn her head when I tried to open her eyes to check pupils.  Both are reactive, though the right appears to be irregular in shape.  Per nursing report, patient has been progressively less cooperative over the weekend, very agitated earlier today pulling out her foley and trying to pull out iv's.   VITAL SIGNS/ANCILLARY NOTES: **Vital Signs.:   08-Jul-13 15:56   Vital Signs Type Routine   Temperature Temperature (F) 97.6   Celsius 36.4   Temperature Source axillary   Pulse Pulse 96   Respirations Respirations 20   Systolic BP Systolic BP 270   Diastolic BP (mmHg) Diastolic BP (mmHg) 99   Mean BP 124   Pulse Ox % Pulse Ox % 98   Pulse Ox Activity Level  At rest   Oxygen Delivery Room Air/ 21 %   Brief Assessment:   Cardiac Regular    Respiratory clear BS    Gastrointestinal details normal Soft  Nontender  Nondistended  No masses palpable  Bowel sounds normal   Lab Results: Thyroid:  04-Jul-13 19:39    Thyroid Stimulating Hormone 2.70 (0.45-4.50 (International Unit)  ----------------------- Pregnant patients have  different reference  ranges for TSH:  - - - - - - - - - -  Pregnant, first trimetser:  0.36 - 2.50 uIU/mL)  Routine Micro:  07-Jul-13 08:54    Micro Text Report URINE CULTURE   COMMENT                    COLONIES TOO SMALL TO READ   ANTIBIOTIC                        Specimen Source IN AND OUT CATH   Culture Comment COLONIES TOO SMALL TO READ  Result(s) reported on 09 Jun 2012 at 08:18AM.  General Ref:  07-Jul-13 05:25    ANA Comprehensive Panel ========== TEST NAME ==========  ========= RESULTS =========  = REFERENCE RANGE =  ANA COMPREHENSIVE PANEL  ANA Comprehensive Panel Anti-DNA (DS) Ab Qn             [   1 IU/mL              ]               0-9               Negative      <5                                                   Equivocal  5 - 9  Positive      >9 RNP Antibodies                  [   <0.2 AI              ]           0.0-0.9 Smith Antibodies                [   <0.2 AI              ]           0.0-0.9 Antiscleroderma-70 Antibodies   [   <0.2 AI              ]           0.0-0.9 Sjogren's Anti-SS-A             [   <0.2 AI              ]           0.0-0.9 Sjogren's Anti-SS-B          [   <0.2 AI              ]           0.0-0.9 Antichromatin Antibodies        [   <0.2 AI              ]           0.0-0.9 Anti-Jo-1                       [   <0.2 AI              ]           0.0-0.9 Anti-Centromere B Antibodies    [   <0.2 AI              ]           0.0-0.9 Anne below:                      [   Final Report         ]                   Autoantibody                       Disease Association ------------------------------------------------------------         Condition                  Frequency ---------------------   ------------------------   --------- Antinuclear Antibody,    SLE, mixed connective Direct (ANA-D)           tissue diseases ---------------------   ------------------------   --------- dsDNA                    SLE                        40 - 60% ---------------------   ------------------------   --------- Chromatin                Drug induced SLE                90%  SLE                         48 - 97% ---------------------   ------------------------   --------- SSA (Ro)                 SLE                        25 - 35%                          Sjogren's Syndrome         40 - 70%                          Neonatal Lupus                 100% ---------------------   ------------------------   --------- SSB (La)                 SLE                             10%                          Sjogren's Syndrome              30% ---------------------   -----------------------    --------- Sm (anti-Smith)          SLE                        15 - 30% ---------------------   -----------------------    --------- RNP                      Mixed Connective Tissue                          Disease                         95% (U1 nRNP,          SLE                        30 - 50% anti-ribonucleoprotein)  Polymyositis and/or                          Dermatomyositis                 20% ---------------------   ------------------------   --------- Scl-70 (antiDNA          Scleroderma (diffuse)      20 - 35% topoisomerase)           Crest                           13% ---------------------   ------------------------   --------- Jo-1                     Polymyositis and/or                          Dermatomyositis  20 - 40% ---------------------   ------------------------   --------- Centromere B             Scleroderma - Crest                          variant                         80%               [facility]            No: [id number]  [address], [place], [place] [address]           Lindon Romp, MD         [phone]   Result(s) reported on 09 Jun 2012 at 12:50PM.  Routine Chem:  06-Jul-13 06:30    Glucose, Serum  189   BUN 8   Creatinine (comp) 0.76   Sodium, Serum 138   Potassium, Serum 3.5   Chloride, Serum 104   CO2, Serum 24   Calcium (Total), Serum 8.5   Anion Gap 10   Osmolality (calc) 279   eGFR (African American) >60   eGFR  (Non-African American) >60 (eGFR values <12m/min/1.73 m2 may be an indication of chronic kidney disease (CKD). Calculated eGFR is useful in patients with stable renal function. The eGFR calculation will not be reliable in acutely ill patients when serum creatinine is changing rapidly. It is not useful in  patients on dialysis. The eGFR calculation may not be applicable to patients at the low and high extremes of body sizes, pregnant women, and vegetarians.)   Magnesium, Serum 1.9 (1.8-2.4 THERAPEUTIC RANGE: 4-7 mg/dL TOXIC: > 10 mg/dL  -----------------------)   Hemoglobin A1c ([facility])  8.9 (The American Diabetes Association recommends that a primary goal of therapy should be <7% and that physicians should reevaluate the treatment regimen in patients with HbA1c values consistently >8%.)  Routine UA:  07-Jul-13 08:54    Color (UA) Yellow   Clarity (UA) Cloudy   Glucose (UA) 50 mg/dL   Bilirubin (UA) Negative   Ketones (UA) 1+   Specific Gravity (UA) 1.016   Blood (UA) 3+   pH (UA) 6.0   Protein (UA) 30 mg/dL   Nitrite (UA) Negative   Leukocyte Esterase (UA) Trace (Result(s) reported on 08 Jun 2012 at 09:09AM.)   RBC (UA) 469 /HPF   WBC (UA) 21 /HPF   Bacteria (UA) TRACE   Epithelial Cells (UA) <1 /HPF   Mucous (UA) PRESENT   Hyaline Cast (UA) 3 /LPF (Result(s) reported on 08 Jun 2012 at 09:09AM.)  Routine Hem:  04-Jul-13 19:39    WBC (CBC) 8.9   Platelet Count (CBC) 245 (Result(s) reported on 05 Jun 2012 at 08:03PM.)  05-Jul-13 04:47    WBC (CBC)  12.3   Platelet Count (CBC) 233  07-Jul-13 05:25    Erythrocyte Sed Rate 4 (Result(s) reported on 08 Jun 2012 at 07:34AM.)   Radiology Results: XRay:    04-Jul-13 20:23, Chest PA and Lateral   Chest PA and Lateral    REASON FOR EXAM:    new onset stroke-like s/s, tachycardia  COMMENTS:       PROCEDURE: DXR - DXR CHEST PA (OR AP) AND LATERAL  - Jun 05 2012  8:23PM     RESULT: There is no previous exam for  comparison.    Cardiac monitoring electrodes are present.  The lungs are clear. The heart   and pulmonary vessels are normal. The bony and mediastinal structures are   unremarkable. There is no effusion. There is no pneumothorax or evidence   of congestive failure.    IMPRESSION:  No acute cardiopulmonary disease.    Dictation Site: 2    Verified By: Sundra Aland, M.D., MD  CT:    04-Jul-13 20:15, CT Head Without Contrast   CT Head Without Contrast    REASON FOR EXAM:    new onset aphasia, hx cva with chronic left side   deficit  COMMENTS:       PROCEDURE: CT  - CT HEAD WITHOUT CONTRAST  - Jun 05 2012  8:15PM     RESULT: Axial noncontrast CT scanning was performed through the brain   with reconstructions at 5 mm intervals and slice thicknesses. Comparison   is made to a previous CT scan of the brain from 2008.    The ventricles are normal in size and position. There is decreased   density in the deep white matter of both cerebral hemispheres consistent   with chronic small vessel ischemic type change. This is most conspicuous   in the right frontal lobe. There is encephalomalacia in the insular   cortex and periphery of the right basal ganglia. Mild ex vacuo dilation     of the body of theright lateral ventricle is seen adjacent to this area.   There is an old lacunar infarction in the anterior aspect of the right   thalamic nucleus which is new since 2008. Hypodensity in the left caudate   nucleus is not new when compared to the previous study, but is more   conspicuous. There is no evidence of an acute intracranial hemorrhage.   The cerebellum and brainstem exhibit no acute abnormality.    At bone window settings the observed portions of the paranasal sinuses   and mastoid air cells are clear. There is no evidence of an acute skull   fracture.    IMPRESSION:    1. Since the previous study there has been progression of white matter   density changes consistent with  chronic small vessel ischemia.   Encephalomalacia on the right is stable. A small lacunar infarction in     the left caudate nucleus on the previous study has evolved into an area   of encephalomalacia as well. The lacunar infarction in the right thalamic   nucleus is new since the previous study.  2. There is no evidence of an acute intracranial hemorrhage nor evidence   of an intracranial mass effect.  3. There is no objective evidence of an acute evolving ischemic   infarction. Followup imaging is recommended if the patient's symptoms   persist.     Dictation Site: 5          Verified By: DAVID A. Martinique, M.D., MD   Assessment/Plan:  Assessment/Plan:   Assessment 1) patient admitted with hypertensive emergency.   2) possible  progressive neurologic deterioration over the past 2 days, currently unable to respond to this examiner either from sedation or neurological deficit.  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  I have discussed the case with Dr Serita Grit and Dr Paulla Dolly, who will be seeing patient soon.  3) dysphagia. Agree that patient will  need to have alternative feedings.  However I think that a sedated proceedure currently may obfuscate the current diagnostic process.  Alternative method, PPN, TPN  may be a better alternative, particularly as NGT and PEG can be pulled by a agitated patient, the latter with serious consequences.    Plan Please note above, would place on iv ppi, as she is may be at high risk for development of cushings ulcers of the stomach/duodenum.  following.   Electronic Signatures: Loistine Simas (MD)  (Signed 08-Jul-13 17:37)  Authored: Chief Complaint, VITAL SIGNS/ANCILLARY NOTES, Brief Assessment, Lab Results, Radiology Results, Assessment/Plan   Last Updated: 08-Jul-13 17:37 by Loistine Simas (MD)

## 2015-03-27 NOTE — Consult Note (Signed)
Chief Complaint:   Subjective/Chief Complaint intermittantly awake, not alert.  can follow some directions.  no apparent bad pain.   VITAL SIGNS/ANCILLARY NOTES: **Vital Signs.:   10-Jul-13 12:17   Vital Signs Type Routine   Pulse Pulse 118   Respirations Respirations 18   Systolic BP Systolic BP 081   Diastolic BP (mmHg) Diastolic BP (mmHg) 388   Mean BP 135   Pulse Ox % Pulse Ox % 98   Pulse Ox Activity Level  At rest   Oxygen Delivery Room Air/ 21 %    17:37   Pulse Pulse 719   Systolic BP Systolic BP 597   Diastolic BP (mmHg) Diastolic BP (mmHg) 471   Mean BP 130   Brief Assessment:   Cardiac Regular    Respiratory clear BS    Gastrointestinal details normal Soft  Nontender  Nondistended  No masses palpable  Bowel sounds normal   Lab Results: Routine Chem:  10-Jul-13 04:48    Magnesium, Serum  1.4 (1.8-2.4 THERAPEUTIC RANGE: 4-7 mg/dL TOXIC: > 10 mg/dL  -----------------------)   Phosphorus, Serum 3.1 (Result(s) reported on 11 Jun 2012 at 06:05AM.)   Creatinine (comp) 0.87   eGFR (African American) >60   eGFR (Non-African American) >60 (eGFR values <22m/min/1.73 m2 may be an indication of chronic kidney disease (CKD). Calculated eGFR is useful in patients with stable renal function. The eGFR calculation will not be reliable in acutely ill patients when serum creatinine is changing rapidly. It is not useful in  patients on dialysis. The eGFR calculation may not be applicable to patients at the low and high extremes of body sizes, pregnant women, and vegetarians.)   Assessment/Plan:  Assessment/Plan:   Assessment 1) cva, in the setting of hypertensive emergency.  bp still elevated, though improving some.  2) dysphagia-patietn to have bedside swallow tomorrow.   3) medical non-compliance    Plan 1) agree with PEG.  will await result of  swallow study.  Hopefully the htn can be better controlled.  Awaiting neuro input as to prognosis. I discussed placement of  the peg with patients son.  Apparently family mnembers are split as to lettign a peg be placed, but understand it may be needed.  Once tomorrows testing is done the need will likely be more apparent.  Following.   Electronic Signatures: SLoistine Simas(MD)  (Signed 10-Jul-13 18:17)  Authored: Chief Complaint, VITAL SIGNS/ANCILLARY NOTES, Brief Assessment, Lab Results, Assessment/Plan   Last Updated: 10-Jul-13 18:17 by SLoistine Simas(MD)

## 2015-03-27 NOTE — Consult Note (Signed)
Comments   I met with pt's son, Don. Pt has 2 other children but neither live locally (one in [place] and one in [place]). I updated son on pt's condition. He understands that, at this point, pt's long-term outcome is uncertain and that she may need PEG for nutrition. Son would like for pt's significant other to be involved in decisions. Though they are not legally married, son says that they have been together for years and he considers him to be her husband. In addition, he would be pt's primary caregiver in the event she is ever able to return home. Will follow pt over the next several days for any improvement and for repeat swallowing evaluation then readdress these issues with all of family with translator present.   Electronic Signatures: Phifer, Nancy (MD)  (Signed 10-Jul-13 17:41)  Authored: Palliative Care   Last Updated: 10-Jul-13 17:41 by Phifer, Nancy (MD) 

## 2015-03-27 NOTE — H&P (Signed)
PATIENT NAMEPAULYNE, [name] MR#:  [id number] DATE OF BIRTH:  June 02, 1961  DATE OF ADMISSION:  06/05/2012  PRIMARY CARE PHYSICIAN: Unassigned.   CHIEF COMPLAINT: Aphasia.   SUBJECTIVE: The patient is a 54 year old female who presents to the ER with a chief complaint of aphasia that started yesterday afternoon around noon. The patient has a history of prior CVA in 2008 and hypertension which has been uncontrolled. Following her last admission in 2008, the patient has been noncompliant with follow-up with a primary care provider and does not take any medications currently. She presented with expressive aphasia worsening and  exacerbation of her left upper extremity weakness. Apparently the patient also had difficulty swallowing. When the patient presented to the ER, her systolic blood pressure was greater than 220. She was also found to be tachycardic with a heart rate in the 130s. The patient denied any dizziness and is completely asymptomatic. CAT scan shows lacunar infarction in the right thalamic nucleus as well as a small lacunar infarction in the left caudate nucleus. The patient denies any headache, blurry vision, denies any vertigo or  dizziness. She denies any weakness in her right upper and bilateral lower extremities.   PAST MEDICAL HISTORY:  1. History of cerebrovascular accident in 2008.  2. Hypertension, presented in hypertensive emergency.   PAST SURGICAL HISTORY: Tubal ligation.   ALLERGIES: No known drug allergies.   CURRENT MEDICATIONS: None.   SOCIAL HISTORY: The patient does not smoke, does not drink. She lives with her husband and her three children.   FAMILY HISTORY: History of CVA in the family. No history of hypertension, diabetes, or dyslipidemia.   REVIEW OF SYSTEMS: CONSTITUTIONAL: No fever, fatigue, weakness, weight loss or weight gain. EYES: No blurry vision, double vision, pain, redness, inflammation, glaucoma. ENT: No tinnitus, ear pain, hearing loss, seasonal  allergies, epistaxis. RESPIRATORY: No cough, wheezing, hemoptysis, dyspnea, asthma. CARDIOVASCULAR: No chest pain, orthopnea, edema, arrhythmia, dyspnea, palpitations, syncope. GASTROINTESTINAL: No nausea, vomiting, abdominal pain, hematemesis, melena or ulcers. GENITOURINARY: No dysuria, hematuria, renal calculi, frequency. ENDOCRINOLOGIC: No polyuria, nocturia, thyroid problems. HEMATOLOGIC: No anemia, easy bruising, bleeding, swollen glands. MUSCULOSKELETAL: No arthritis, cramps, swelling, gout, redness. NEUROLOGIC: Positive for weakness in the left upper extremity with mild contracture of the left upper extremity and significant expressive aphasia. No ataxia or vertigo. PSYCHIATRIC: No history of bipolar depression, schizophrenia or nervousness.   PHYSICAL EXAMINATION:  VITAL SIGNS: Blood pressure 211/110, heart rate 123.   GENERAL: Alert and oriented to self and place.   NECK: Supple. No JVD No carotid bruit.   LUNGS: Clear to auscultation bilaterally. No wheezes, no crackles, no rhonchi.   CARDIOVASCULAR: Regular rate and rhythm. No murmurs, rubs, or gallops.   ABDOMEN: Soft, nontender, nondistended. Normoactive bowel sounds.   EXTREMITIES: Without cyanosis, clubbing, or edema.   NEUROLOGIC: Expressive aphasia with cranial nerves II through XII otherwise grossly intact . Contracture of the left upper extremity with muscle wasting of the left upper extremity from an old CVA. Deep tendon reflexes 2+ bilaterally in bilateral lower extremities and the right upper extremity.   SKIN: Without any skin rashes.   PSYCHIATRIC: Alert and oriented to self and place.   LYMPHATIC: No axillary, inguinal or cervical lymphadenopathy.   LABORATORY, DIAGNOSTIC, AND RADIOLOGICAL DATA:  Urinalysis negative.  CT of the head shows small vessel ischemia and encephalomalacia on the right side, small lacunar infarction in the left caudate nucleus, and lacunar infarction in the right thalamic nucleus. No acute  hemorrhage.  D-dimer 0.70.  INR 0.9. CK 184. Troponin 0.03. Sodium 139, potassium 2.8, chloride 99, bicarbonate 29, ALT 28, AST 28, serum albumin 9.5, anion gap 11.  Hemoglobin 15.8, hematocrit 49.1, platelet count of 245.   ASSESSMENT:  1. Acute CVA/lacunar infarction.  2. Hypertensive crisis.  3. Hypokalemia.  4. Prior history of CVA.  5. Abnormal D-dimer.   PLAN:  1. The patient will be admitted to the Critical Care Unit. Currently she is on a nicardipine drip for her hypertensive emergency. Once bedside swallow screen is done, the patient will be initiated on an aggressive regimen of oral    , although we should cautiously and judiciously lower her blood pressure over the course of the next 24 hours.  2. We will further work-up including a 2-D echo and a carotid Doppler to further evaluate her recurrent stroke.  3. Given her young age, a vasculitic work-up should also be pursued.  4. Sinus tachycardia in the setting of elevated d-dimer: A CT of the chest will be done to rule out a PE. We will start the patient on beta blocker.  5. Risk factor stratification, namely hemoglobin A1c and a lipid panel will be done.  PT, OT, SLP evaluation will be obtained.   CODE STATUS:  She is a FULL CODE.     time spent 70 mins ____________________________ Richarda OverlieNayana Luis Nickles, MD na:cbb D: 06/05/2012 21:48:30 ET T: 06/06/2012 06:22:40 ET JOB#: [id number]  cc: Richarda OverlieNayana Kimsey Demaree, MD, <Dictator> Richarda OverlieNAYANA Lucerito Rosinski MD ELECTRONICALLY SIGNED 06/10/2012 22:44

## 2015-03-27 NOTE — Op Note (Signed)
PATIENT NAMRalene Cowan:  Arrants, Suda MR#:  [id number] DATE OF BIRTH:  1961/08/05  DATE OF PROCEDURE:  06/11/2012  PREOPERATIVE DIAGNOSES:  1. Lack of appropriate IV access.  2. Massive cerebrovascular accident.   POSTOPERATIVE DIAGNOSES: 1. Lack of appropriate IV access.  2. Massive cerebrovascular accident.   PROCEDURES PERFORMED: Right IJ triple lumen catheter with ultrasound guidance for central venous access.   SURGEON: Levora DredgeGregory Sofia Vanmeter, M.D.   DESCRIPTION OF PROCEDURE: The patient is in the hospital room. She is positioned supine. The right neck is prepped and draped in sterile fashion. Ultrasound is placed in a sterile sleeve. Ultrasound is utilized secondary to lack of appropriate landmarks and to avoid vascular injury. Under direct ultrasound visualization, the jugular vein is identified. It is echolucent and compressible indicating patency. Image is recorded. Under real-time visualization, a Seldinger needle is inserted into the jugular vein. J-wire is then advanced without difficulty. Dilator is passed over the wire followed by placement of a triple lumen catheter. Catheter is then secured to the skin of the neck with the 2-0 silk and a sterile dressing is applied. All three lumens aspirate and flush easily. Chest x-ray is pending. ____________________________ Renford DillsGregory G. Nika Yazzie, MD ggs:slb D: 06/11/2012 15:25:36 ET     T: 06/11/2012 16:05:24 ET        JOB#: [id number] cc: Renford DillsGregory G. Darlin Stenseth, MD, <Dictator> Renford DillsGREGORY G Obe Ahlers MD ELECTRONICALLY SIGNED 06/26/2012 12:26

## 2015-03-27 NOTE — Consult Note (Signed)
Brief Consult Note: Diagnosis: CVA.   Patient was seen by consultant.   Consult note dictated.   Discussed with Attending MD.   Comments: Appreciate consult for 54 y/o [place] woman admitted for acute CVA, in regards to PEG placement. Their is not an available interpeter at this time, and there is difficulty with the language line. She evidentally had a reasonable swallow study/speech eval Friday 5 Jul, and was able to walk and feed herself per nursing staff report- then had a fall on Sunday and neurologic decline: agitating easily, pulling tubes out, repeatedly trying to get out of bed.She then was unable to walk, feed herself, and she developed difficulty swallowing. She was given some morphine (2mg  ) around 1500 today due to agitation/restlessness. She is currently not responding well at all, has an irregular shaped right pupil, that is slow to respond, and her neck is somewhat rigid. CT angiography demonstrated narrowing in the basilar artery today and concerns for cerebral vasculitis.  Impression and plan: This patient was examined by both Dr Marva PandaSkulskie and myself.  Her case has been discussed with Dr Allena KatzPatel and Dr Kemper Durielarke. We are currently recommending to hold plans for any sedated procedures at present, and for a neuro eval asap/possible medical center transfer due to her neurological decline. In regards to feeding, she can receive PPN, or alternate feeding method for now.  Electronic Signatures: Vevelyn PatLondon, Christiane H (NP)  (Signed 08-Jul-13 17:29)  Authored: Brief Consult Note   Last Updated: 08-Jul-13 17:29 by Keturah BarreLondon, Christiane H (NP)

## 2015-03-27 NOTE — Consult Note (Signed)
PATIENT NAMEDARLEEN, MOFFITT MR#:  [id number] DATE OF BIRTH:  August 14, 1961  DATE OF CONSULTATION:  06/08/2012  REFERRING PHYSICIAN:   CONSULTING PHYSICIAN:  Lunabelle Oatley B. Karriem Muench, MD  REASON FOR CONSULTATION: Stroke.   HISTORY OF PRESENT ILLNESS: This is a 54 year old [place] female with a past medical history of stroke in 2008 with residual left-sided weakness and also history of hypertension who presented to the emergency department on 06/05/2012 with acute onset of aphasia and dysphagia. History is obtained through her daughter who translates. The patient speaks very little [place]. The family speaks [place]. Also her husband does not speak much [place]. Apparently on 06/05/2012 she had the acute onset of inability to speak and worsening of her left upper extremity weakness. She also had difficulty swallowing. Throughout her hospital stay here, she has had no significant improvement in her speech or ability to swallow. She has been noted to have dysphasia by Speech and is planned for modified barium swallow on Monday. The patient has had significant hypertension since being admitted. Her systolic blood pressure was 200 when she presented. She is now on several antihypertensives. Initial CT scan showed lacunar infarcts in the right thalamus as well as small lacunar infarcts in the left caudate. A MRI was attempted, but she was not able to sit still so it was aborted. A repeat CT of the brain yesterday showed a new right frontal hypodensity that was not present on the first CAT scan. Since her stroke in 2008, she has not been taking any of her medications and hence has not followed up with a primary care provider. It is unclear why she has not followed up with a doctor. Since her stroke in 2008, she has had significant left upper extremity weakness. She is able to ambulate without assistance, but does use a cane.   REVIEW OF SYSTEMS: A 14-point review of systems is unable to be completed due to the patient's  aphasia.   PAST MEDICAL HISTORY: 1. Stroke in 2008.  2. Hypertension.   PAST SURGICAL HISTORY: Tubal ligation.   ALLERGIES: No known drug allergies.   HOME MEDICATIONS: None.   SOCIAL HISTORY: No smoking, alcohol, or illicits. She lives with her husband and three children.   FAMILY HISTORY: There is a history of stroke in the family.   PHYSICAL EXAMINATION:   VITAL SIGNS: Temperature 98, pulse 99, respirations 18, blood pressure 148/88, and pulse oximetry 98% on room air.   GENERAL: Supine, no apparent distress.   CARDIOVASCULAR: Regular rate and rhythm.   RESPIRATORY: Clear anteriorly.   ABDOMEN: Soft.   SKIN: No rashes or lesions.   MENTAL STATUS: She awakens to verbal stimuli. She is able to tell me her name, but is not able to say anything else when asked about. She is not able to repeat. She is not able to name any objects. She does follow some simple commands, but not all.   CRANIAL NERVES: Pupils equal, round, and reactive to light. She blinks to threat bilaterally. Extraocular movements are intact. No obvious facial sensory deficits. Facial expressions are symmetric. Tongue is midline.   MOTOR: She does not participate in strength testing. The right upper extremity is antigravity. The bilateral lower extremities are also antigravity. The left upper extremity does not have any movement and there is increased tone in the left upper extremity.   DEEP TENDON REFLEXES: She is hyperreflexia on the left.   SENSATION: Unable to assess, but withdraws to pinch in the right upper and bilateral  lower extremities.   COORDINATION: Unable to assess.   GAIT: Unable to assess.   LABS: BMP is within normal limits.   CBC is significant for mild leukocytosis of 12.3.  Cardiac enzymes are negative.   Hemoglobin A1c is 8.9.   ESR is 4.   LDL is 140.   IMAGING: Noncontrast head CT from 06/05/2012 shows chronic small vessel ischemic disease. There is a small lacunar infarct in  the left caudate. There is also an old lacunar infarction in the right thalamus.   Noncontrast CT of the brain from 06/07/2012 shows an area of low attenuation in the white matter adjacent to the right frontal horn and lateral ventricle which is new compared with the first head CT. There is also subacute versus chronic right thalamic lacunar infarct.   Echocardiogram is normal and shows an ejection fraction of greater than 55%.   Carotid Dopplers are incomplete studies, but show no severe stenosis bilaterally.   ASSESSMENT AND PLAN: Ms. Fojtik is a 54 year old female with history of hypertension and stroke in 2008 with residual left upper and lower extremity weakness who is now also found to be diabetic and who is admitted for aphasia and dysphagia. Although her CT shows evidence of these on the right, I suspect there has also been an infarct on the left given her degree of aphasia. This is concerning for cardioembolic phenomenon if in fact she does have acute bilateral infarcts.   RECOMMENDATIONS:  1. MRI of the brain needs to be performed. I suspect that we will be able to obtain the MRI if there is a family member that is able to go down to MRI with her and instruct her through the process. Hopefully her daughter will be able to do this tomorrow.  2. CT angiogram of the head and neck to evaluate for stenosis (Carotid Dopplers were incomplete studies).  3. Continue aspirin, but can change to 81 mg p.o. daily once she is cleared to take p.o..  4. Continue simvastatin (goal LDL is less than 70).  5. Continue blood pressure control.  6. Continue glycemic control.  7. Continue telemetry to evaluate for atrial fibrillation.  8. Send hypercoagulable panel to [facility] given her young age of strokes.  9. If MRI shows bilateral acute infarcts, I would suggest a transesophageal echocardiogram and outpatient Holter to look for a cardioembolic source.  10. Continue physical therapy, occupational  therapy, and speech therapy.   Thank you for this consultation. We will continue to follow. ____________________________ Micheline Maze Sabrena Gavitt, MD cbs:slb D: 06/08/2012 16:02:05 ET T: 06/09/2012 10:11:59 ET JOB#: [id number]  cc: Chiamaka Latka B. Steele Berg, MD, <Dictator>  Arelia Sneddon MD ELECTRONICALLY SIGNED 07/19/2012 14:00

## 2015-03-27 NOTE — Consult Note (Signed)
PATIENT NAMEVERTA, Cowan MR#:  [id number] DATE OF BIRTH:  Jun 20, 1961  DATE OF CONSULTATION:  06/09/2012  REFERRING PHYSICIAN:   CONSULTING PHYSICIAN:  Theodore Demark, NP  PRIMARY CARE PHYSICIAN: Unassigned    HISTORY OF PRESENT ILLNESS: Ms. [name] is a 54 year old [place] woman who was admitted on 07/04 with acute cerebrovascular accident. GI has been consulted at the request of Dr. Posey Pronto for dysphagia/PEG tube placement. There is not an available interpreter at this time and there is difficulty with the language line. Her boyfriend is present at bedside, however, he speaks very limited [place]. Information was gathered from the nursing staff and from chart review. She evidently had a reasonable swallow study, speech evaluation on Friday, 07/05, and was able to walk and feed herself per nursing staff report. Then had a fall early Sunday morning and since then has been agitated easily, pulling at tubes, did pull her Foley out and repeatedly has been trying to get out of bed and very restless. She lost the ability to walk, feed herself and developed difficulty swallowing. Today she was given some morphine 2 mg about 15:00 due to agitation and restlessness. Since then she has been drowsy. Currently she is not responding well at all. She has an irregular-shaped right pupil that is slow to respond when compared to the left and her neck is somewhat rigid. CT angiography today demonstrated narrowing in the basal artery and concerns for cerebral vasculitis. She has not appeared per report to have any abdominal pain, nausea, vomiting, or other GI symptoms.   PAST MEDICAL HISTORY:  1. Cerebrovascular accident 2008. 2. Hypertension.  3. Did present to the Emergency Department this visit in hypertensive emergency.   PAST SURGICAL HISTORY: Tubal ligation.   ALLERGIES: No known drug allergies.   CURRENT MEDICATIONS: None.  SOCIAL HISTORY: As per the history and physical no alcohol or tobacco.  Lives with her boyfriend and three children.   FAMILY HISTORY: Positive for CVA. No history of hypertension, diabetes or hyperlipidemia.   REVIEW OF SYSTEMS: Unable to be obtained at this time due to patient neurologic status and inability to access language line or interpreter. Per the chart review it appears that her only symptoms have been left upper extremity weakness and expressive aphasia. A 10 point review and other aspects are negative.   LABORATORY, DIAGNOSTIC AND RADIOLOGICAL DATA: Most recent lab work: Serum glucose 189, BUN 8, creatinine 0.76, sodium 138, potassium 3.5, GFR greater than 60, calcium 8.5, magnesium 1.9, hemoglobin A1c 8.9, ESR 4. CBC from 07/05: WBC 12.3, hemoglobin 13.8, hematocrit 41.9, platelet count 233. Red cells are normocytic with a normal distribution. Did have elevated d-dimer on 07/04. Did undergo chest CT for pulmonary embolism and was found to be negative. CT head on 07/06 with a questionable watershed infarct and a subacute versus chronic right thalamic lacunar infarct. 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 Did undergo echocardiogram with left ventricular ejection fraction of 55%.   PHYSICAL EXAMINATION:  VITAL SIGNS: Most recent vital signs: Temperature 97.6, pulse 96, respiratory rate 20, blood pressure 176/99, oxygen saturation 98% on room air.   GENERAL: Comfortable-appearing woman lying in bed in no apparent distress, however, does not respond well at this time.   HEENT: Normocephalic, atraumatic. No redness, drainage, or inflammation to the eyes, nares or mouth.   NEUROLOGIC: Not responsive. Right pupil irregularly shaped in a squarish fashion, responds sluggishly, approximately 3 mm. Left pupil is round with  brisk reaction at 3 mm. Unable to determine cranial nerves at present.   NECK: Neck is felt to be somewhat rigid.   RESPIRATORY: Respirations eupneic. Lungs clear bilaterally.    CARDIOVASCULAR: Regular rate and rhythm. No MRG. S1, S2. No appreciable edema.   ABDOMEN: Soft, nondistended, no apparent tenderness.   EXTREMITIES: Without cyanosis, clubbing, or edema. No spontaneous movement noted at present.   SKIN: No erythema, lesion or rash.   PSYCH: Deferred at this time.   GENITOURINARY: Deferred at this time.   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 This patient was examined by both Dr. Gustavo Lah and myself. Her case has been discussed with Dr. Posey Pronto and Dr. Loletta Specter. We are currently recommending to hold plans for any sedated procedures at present and for a neurological evaluation as soon as possible/possible medical center transfer due to neurological decline. In regards to feeding she can receive PPN or another alternative feeding method for now.   These services were provided by Stephens November, MSN, [facility] in collaboration with Loistine Simas, M.D. with whom this patient was examined.  ____________________________ Theodore Demark, NP chl:cms D: 06/09/2012 17:40:16 ET T: 06/10/2012 05:30:28 ET JOB#: [id number]  cc: Theodore Demark, NP, <Dictator> [place] SIGNED 06/12/2012 19:34

## 2017-08-22 ENCOUNTER — Ambulatory Visit (INDEPENDENT_AMBULATORY_CARE_PROVIDER_SITE_OTHER): Payer: Medicaid Other | Admitting: Vascular Surgery

## 2017-08-22 ENCOUNTER — Other Ambulatory Visit (INDEPENDENT_AMBULATORY_CARE_PROVIDER_SITE_OTHER): Payer: Self-pay | Admitting: Vascular Surgery

## 2017-08-22 ENCOUNTER — Encounter (INDEPENDENT_AMBULATORY_CARE_PROVIDER_SITE_OTHER): Payer: Self-pay

## 2017-08-22 ENCOUNTER — Encounter (INDEPENDENT_AMBULATORY_CARE_PROVIDER_SITE_OTHER): Payer: Self-pay | Admitting: Vascular Surgery

## 2017-08-22 VITALS — BP 126/82 | HR 78 | Resp 16 | Ht 64.0 in

## 2017-08-22 DIAGNOSIS — I10 Essential (primary) hypertension: Secondary | ICD-10-CM

## 2017-08-22 DIAGNOSIS — E118 Type 2 diabetes mellitus with unspecified complications: Secondary | ICD-10-CM | POA: Diagnosis not present

## 2017-08-22 DIAGNOSIS — I82402 Acute embolism and thrombosis of unspecified deep veins of left lower extremity: Secondary | ICD-10-CM

## 2017-08-22 NOTE — Progress Notes (Signed)
Subjective:    Patient ID: Kiarra Mellin, female    DOB: [birth year], 56 y.o.   MRN: [id number] Chief Complaint  Patient presents with  . New Patient (Initial Visit)    Chronic persistent DVT   Presents as a new patient referred by Dr. Madaline Guthrie for a left lower extremity DVT. Seen with patient's son who seems to be her legal guardian and interpreter. Son states about 2 weeks ago, the patient experienced left lower extremity edema which prompted medical staff to order a DVT study. Patient was found to have an acute nonocclusive deep vein thrombosis within the left common femoral, left proximal femoral, left mid moral, left distal femoral, left popliteal and left posterior tibial veins. Acute nonocclusive thrombosis noted within the left greater saphenous vein, superficial vein. There is no notation with the patient outlining if any trauma had occurred to the lower extremity. The son is not aware of any trauma to the lower extremity. Patient does not ambulate much she is usually sitting in a dependent position in her wheelchair. Son able to ask the patient if she is experiencing any shortness of breath and chest pain which - she denied. Minimal discomfort to lower extremity. Denies any fever, nausea or vomiting.    Review of Systems  Constitutional: Negative.   HENT: Negative.   Eyes: Negative.   Respiratory: Negative.   Cardiovascular: Positive for leg swelling.  Gastrointestinal: Negative.   Endocrine: Negative.   Genitourinary: Negative.   Musculoskeletal: Negative.   Skin: Negative.   Allergic/Immunologic: Negative.   Neurological: Negative.   Hematological: Negative.   Psychiatric/Behavioral: Negative.       Objective:   Physical Exam  Constitutional: She is oriented to person, place, and time. She appears well-developed and well-nourished. No distress.  HENT:  Head: Normocephalic and atraumatic.  Eyes: Pupils are equal, round, and reactive to light. Conjunctivae are normal.    Neck: Normal range of motion.  Cardiovascular: Normal rate, regular rhythm, normal heart sounds and intact distal pulses.   Pulses:      Radial pulses are 2+ on the right side, and 2+ on the left side.       Dorsalis pedis pulses are 2+ on the right side.       Posterior tibial pulses are 2+ on the right side.  Unable to palpate pedal pulses due to edema however bilateral feet are warm  Musculoskeletal: Normal range of motion. She exhibits edema (Severe left lower extremity edema noted.).  Neurological: She is alert and oriented to person, place, and time.  Skin: Skin is warm and dry. She is not diaphoretic.  Psychiatric: She has a normal mood and affect. Her behavior is normal. Judgment and thought content normal.  Vitals reviewed.  BP 126/82 (BP Location: Right Arm)   Pulse 78   Resp 16   Ht  (1.626 m)   Past Medical History:  Diagnosis Date  . Diabetes mellitus without complication (HCC)   . GERD (gastroesophageal reflux disease)   . Hypertension   . Lack of coordination   . Vertigo    Social History   Social History  . Marital status: Single    Spouse name: N/A  . Number of children: N/A  . Years of education: N/A   Occupational History  . Not on file.   Social History Main Topics  . Smoking status: Never Smoker  . Smokeless tobacco: Never Used  . Alcohol use No  . Drug use: Unknown  . Sexual activity:  Not on file   Other Topics Concern  . Not on file   Social History Narrative  . No narrative on file   No past surgical history on file.  No family history on file.  No Known Allergies     Assessment & Plan:  Presents as a new patient referred by Dr. Madaline Guthrie for a left lower extremity DVT. Seen with patient's son who seems to be her legal guardian and interpreter. Son states about 2 weeks ago, the patient experienced left lower extremity edema which prompted medical staff to order a DVT study. Patient was found to have an acute nonocclusive deep vein  thrombosis within the left common femoral, left proximal femoral, left mid moral, left distal femoral, left popliteal and left posterior tibial veins. Acute nonocclusive thrombosis noted within the left greater saphenous vein, superficial vein. There is no notation with the patient outlining if any trauma had occurred to the lower extremity. The son is not aware of any trauma to the lower extremity. Patient does not ambulate much she is usually sitting in a dependent position in her wheelchair. Son able to ask the patient if she is experiencing any shortness of breath and chest pain which - she denied. Minimal discomfort to lower extremity. Denies any fever, nausea or vomiting.   1. Acute deep vein thrombosis (DVT) of left lower extremity, unspecified vein (HCC) - New Patient presents with what seems like a unprovoked DVT to the left lower extremity deep and superficial system. Patient was placed on his arrival toe which she continues to take. Patient presents today with severe edema to the left lower extremity almost 3 times the size when compared to the right. Recommend left lower extremity DVT lysis to improve severe edema to the left lower extremity. Procedure, risks and benefits explained to the patient All questions answered Patient and son wish to proceed  2. Type 2 diabetes mellitus with complication, unspecified whether long term insulin use (HCC) - stable Encouraged good control as its slows the progression of atherosclerotic disease  3. Essential hypertension - stable Encouraged good control as its slows the progression of atherosclerotic disease  No current outpatient prescriptions on file prior to visit.   No current facility-administered medications on file prior to visit.     There are no Patient Instructions on file for this visit. No Follow-up on file.   KIMBERLY A STEGMAYER, PA-C

## 2017-08-27 ENCOUNTER — Encounter
Admission: RE | Disposition: A | Discharge status: Skilled Nursing Facility | Payer: Self-pay | Source: Ambulatory Visit | Attending: Vascular Surgery

## 2017-08-27 ENCOUNTER — Inpatient Hospital Stay
Admission: RE | Admit: 2017-08-27 | Discharge: 2017-08-29 | DRG: 271 | Disposition: A | Discharge status: Skilled Nursing Facility | Payer: Medicaid Other | Source: Ambulatory Visit | Attending: Vascular Surgery | Admitting: Vascular Surgery

## 2017-08-27 ENCOUNTER — Encounter: Payer: Self-pay | Admitting: *Deleted

## 2017-08-27 DIAGNOSIS — Z794 Long term (current) use of insulin: Secondary | ICD-10-CM

## 2017-08-27 DIAGNOSIS — R000 Tachycardia, unspecified: Secondary | ICD-10-CM | POA: Diagnosis present

## 2017-08-27 DIAGNOSIS — Z5309 Procedure and treatment not carried out because of other contraindication: Secondary | ICD-10-CM | POA: Diagnosis present

## 2017-08-27 DIAGNOSIS — K219 Gastro-esophageal reflux disease without esophagitis: Secondary | ICD-10-CM | POA: Diagnosis present

## 2017-08-27 DIAGNOSIS — I10 Essential (primary) hypertension: Secondary | ICD-10-CM | POA: Diagnosis present

## 2017-08-27 DIAGNOSIS — I82422 Acute embolism and thrombosis of left iliac vein: Secondary | ICD-10-CM | POA: Diagnosis present

## 2017-08-27 DIAGNOSIS — R0902 Hypoxemia: Secondary | ICD-10-CM | POA: Diagnosis present

## 2017-08-27 DIAGNOSIS — E118 Type 2 diabetes mellitus with unspecified complications: Secondary | ICD-10-CM | POA: Diagnosis present

## 2017-08-27 DIAGNOSIS — I871 Compression of vein: Secondary | ICD-10-CM | POA: Diagnosis present

## 2017-08-27 DIAGNOSIS — I82412 Acute embolism and thrombosis of left femoral vein: Secondary | ICD-10-CM | POA: Diagnosis present

## 2017-08-27 DIAGNOSIS — I82402 Acute embolism and thrombosis of unspecified deep veins of left lower extremity: Secondary | ICD-10-CM | POA: Diagnosis not present

## 2017-08-27 DIAGNOSIS — I69354 Hemiplegia and hemiparesis following cerebral infarction affecting left non-dominant side: Secondary | ICD-10-CM

## 2017-08-27 DIAGNOSIS — R451 Restlessness and agitation: Secondary | ICD-10-CM | POA: Diagnosis present

## 2017-08-27 DIAGNOSIS — I69320 Aphasia following cerebral infarction: Secondary | ICD-10-CM

## 2017-08-27 DIAGNOSIS — I82432 Acute embolism and thrombosis of left popliteal vein: Principal | ICD-10-CM | POA: Diagnosis present

## 2017-08-27 HISTORY — DX: Aphasia: R47.01

## 2017-08-27 HISTORY — DX: Hemiplegia, unspecified affecting unspecified side: G81.90

## 2017-08-27 HISTORY — DX: Cerebral infarction, unspecified: I63.9

## 2017-08-27 LAB — PROTIME-INR
INR: 1.2
Prothrombin Time: 15.1 seconds (ref 11.4–15.2)

## 2017-08-27 LAB — APTT: aPTT: >160 seconds (ref 24–36)

## 2017-08-27 LAB — GLUCOSE, CAPILLARY
GLUCOSE-CAPILLARY: 162 mg/dL — AB (ref 65–99)
Glucose-Capillary: 134 mg/dL — ABNORMAL HIGH (ref 65–99)

## 2017-08-27 LAB — CREATININE, SERUM: Creatinine, Ser: 0.84 mg/dL (ref 0.44–1.00)

## 2017-08-27 LAB — HEPARIN LEVEL (UNFRACTIONATED): HEPARIN UNFRACTIONATED: 1.51 [IU]/mL — AB (ref 0.30–0.70)

## 2017-08-27 LAB — HEMOGLOBIN A1C
Hgb A1c MFr Bld: 7.1 % — ABNORMAL HIGH (ref 4.8–5.6)
MEAN PLASMA GLUCOSE: 157.07 mg/dL

## 2017-08-27 LAB — BUN: BUN: 17 mg/dL (ref 6–20)

## 2017-08-27 SURGERY — INVASIVE LAB ABORTED CASE
Anesthesia: Moderate Sedation | Laterality: Left

## 2017-08-27 MED ORDER — SORBITOL 70 % SOLN
30.0000 mL | Freq: Every day | Status: DC | PRN
  Filled 2017-08-27: qty 30

## 2017-08-27 MED ORDER — GLUCERNA SHAKE PO LIQD: 237.0000 mL | Freq: Every day | ORAL | Status: DC

## 2017-08-27 MED ORDER — DEXTROSE-NACL 5-0.9 % IV SOLN: INTRAVENOUS | Status: DC

## 2017-08-27 MED ORDER — HEPARIN SODIUM (PORCINE) 1000 UNIT/ML IJ SOLN
INTRAMUSCULAR | Status: AC
  Filled 2017-08-27: qty 1

## 2017-08-27 MED ORDER — METOPROLOL SUCCINATE ER 50 MG PO TB24: 100.0000 mg | ORAL_TABLET | Freq: Every day | ORAL | Status: DC

## 2017-08-27 MED ORDER — HEPARIN (PORCINE) IN NACL 100-0.45 UNIT/ML-% IJ SOLN
1000.0000 [IU]/h | INTRAMUSCULAR | Status: DC
  Administered 2017-08-27: 1000 [IU]/h via INTRAVENOUS

## 2017-08-27 MED ORDER — FENTANYL CITRATE (PF) 100 MCG/2ML IJ SOLN
INTRAMUSCULAR | Status: AC
  Filled 2017-08-27: qty 2

## 2017-08-27 MED ORDER — INSULIN ASPART 100 UNIT/ML ~~LOC~~ SOLN
0.0000 [IU] | Freq: Three times a day (TID) | SUBCUTANEOUS | Status: DC
  Administered 2017-08-28: 2 [IU] via SUBCUTANEOUS
  Administered 2017-08-29: 1 [IU] via SUBCUTANEOUS
  Administered 2017-08-29: 2 [IU] via SUBCUTANEOUS
  Filled 2017-08-27 (×3): qty 1

## 2017-08-27 MED ORDER — ONDANSETRON HCL 4 MG PO TABS
4.0000 mg | ORAL_TABLET | Freq: Four times a day (QID) | ORAL | Status: DC | PRN
Start: 2017-08-27 — End: 2017-08-27

## 2017-08-27 MED ORDER — POLYETHYLENE GLYCOL 3350 17 G PO PACK: 17.0000 g | PACK | Freq: Every day | ORAL | Status: DC

## 2017-08-27 MED ORDER — SODIUM CHLORIDE 0.9 % IV SOLN
INTRAVENOUS | Status: DC
  Administered 2017-08-27: 1000 mL via INTRAVENOUS
  Administered 2017-08-28 (×3): via INTRAVENOUS

## 2017-08-27 MED ORDER — LIDOCAINE HCL (PF) 1 % IJ SOLN
INTRAMUSCULAR | Status: AC
  Filled 2017-08-27: qty 30

## 2017-08-27 MED ORDER — ASPIRIN 81 MG PO CHEW: 81.0000 mg | CHEWABLE_TABLET | Freq: Every day | ORAL | Status: DC

## 2017-08-27 MED ORDER — HYDROCODONE-ACETAMINOPHEN 5-325 MG PO TABS
1.0000 | ORAL_TABLET | ORAL | Status: DC | PRN
  Administered 2017-08-27: 1
  Filled 2017-08-27: qty 1

## 2017-08-27 MED ORDER — FLEET ENEMA 7-19 GM/118ML RE ENEM: 1.0000 | ENEMA | Freq: Once | RECTAL | Status: DC | PRN

## 2017-08-27 MED ORDER — BISACODYL 10 MG RE SUPP: 10.0000 mg | Freq: Every day | RECTAL | Status: DC | PRN

## 2017-08-27 MED ORDER — MIDAZOLAM HCL 5 MG/5ML IJ SOLN
INTRAMUSCULAR | Status: AC
  Filled 2017-08-27: qty 5

## 2017-08-27 MED ORDER — ONDANSETRON HCL 4 MG/2ML IJ SOLN: 4.0000 mg | Freq: Four times a day (QID) | INTRAMUSCULAR | Status: DC | PRN

## 2017-08-27 MED ORDER — HEPARIN (PORCINE) IN NACL 100-0.45 UNIT/ML-% IJ SOLN
INTRAMUSCULAR | Status: AC
  Filled 2017-08-27: qty 250

## 2017-08-27 MED ORDER — POLYETHYLENE GLYCOL 3350 17 G PO PACK
17.0000 g | PACK | Freq: Every day | ORAL | Status: DC
  Administered 2017-08-27 – 2017-08-28 (×2): 17 g
  Filled 2017-08-27 (×2): qty 1

## 2017-08-27 MED ORDER — CEFAZOLIN SODIUM-DEXTROSE 2-4 GM/100ML-% IV SOLN: 2.0000 g | Freq: Once | INTRAVENOUS | Status: DC

## 2017-08-27 MED ORDER — MIDAZOLAM HCL 2 MG/2ML IJ SOLN
INTRAMUSCULAR | Status: DC | PRN
  Administered 2017-08-27: 1 mg via INTRAVENOUS

## 2017-08-27 MED ORDER — CLONIDINE HCL 0.3 MG/24HR TD PTWK
0.3000 mg | MEDICATED_PATCH | TRANSDERMAL | Status: DC
  Filled 2017-08-27: qty 1

## 2017-08-27 MED ORDER — ACETAMINOPHEN-CODEINE #3 300-30 MG PO TABS: 1.0000 | ORAL_TABLET | Freq: Four times a day (QID) | ORAL | Status: DC | PRN

## 2017-08-27 MED ORDER — HYDROCODONE-ACETAMINOPHEN 5-325 MG PO TABS: 1.0000 | ORAL_TABLET | ORAL | Status: DC | PRN

## 2017-08-27 MED ORDER — FENTANYL CITRATE (PF) 100 MCG/2ML IJ SOLN
50.0000 ug | Freq: Once | INTRAMUSCULAR | Status: AC
  Administered 2017-08-27: 50 ug via INTRAVENOUS

## 2017-08-27 MED ORDER — METHYLPREDNISOLONE SODIUM SUCC 125 MG IJ SOLR: 125.0000 mg | INTRAMUSCULAR | Status: DC | PRN

## 2017-08-27 MED ORDER — DOCUSATE SODIUM 100 MG PO CAPS: 100.0000 mg | ORAL_CAPSULE | Freq: Two times a day (BID) | ORAL | Status: DC

## 2017-08-27 MED ORDER — SODIUM CHLORIDE 0.9 % IV SOLN
INTRAVENOUS | Status: DC
  Administered 2017-08-27: 15:00:00 via INTRAVENOUS

## 2017-08-27 MED ORDER — FAMOTIDINE 20 MG PO TABS: 40.0000 mg | ORAL_TABLET | ORAL | Status: DC | PRN

## 2017-08-27 MED ORDER — INSULIN GLARGINE 100 UNIT/ML ~~LOC~~ SOLN
4.0000 [IU] | Freq: Every day | SUBCUTANEOUS | Status: DC
  Administered 2017-08-27 – 2017-08-28 (×2): 4 [IU] via SUBCUTANEOUS
  Filled 2017-08-27 (×4): qty 0.04

## 2017-08-27 MED ORDER — METOPROLOL TARTRATE 50 MG PO TABS
50.0000 mg | ORAL_TABLET | Freq: Two times a day (BID) | ORAL | Status: DC
  Administered 2017-08-27 – 2017-08-29 (×4): 50 mg
  Filled 2017-08-27 (×4): qty 1

## 2017-08-27 MED ORDER — ASPIRIN 81 MG PO CHEW
81.0000 mg | CHEWABLE_TABLET | Freq: Every day | ORAL | Status: DC
  Administered 2017-08-28 – 2017-08-29 (×2): 81 mg
  Filled 2017-08-27 (×2): qty 1

## 2017-08-27 MED ORDER — MAGNESIUM HYDROXIDE 400 MG/5ML PO SUSP: 30.0000 mL | Freq: Every day | ORAL | Status: DC | PRN

## 2017-08-27 MED ORDER — HEPARIN BOLUS VIA INFUSION
4000.0000 [IU] | Freq: Once | INTRAVENOUS | Status: AC
  Administered 2017-08-27: 4000 [IU] via INTRAVENOUS
  Filled 2017-08-27: qty 4000

## 2017-08-27 MED ORDER — HEPARIN BOLUS VIA INFUSION: 3000.0000 [IU] | Freq: Once | INTRAVENOUS | Status: DC

## 2017-08-27 MED ORDER — MAGNESIUM OXIDE 400 (241.3 MG) MG PO TABS: 400.0000 mg | ORAL_TABLET | Freq: Every day | ORAL | Status: DC

## 2017-08-27 SURGICAL SUPPLY — 7 items
DEVICE SAFEGUARD 24CM (GAUZE/BANDAGES/DRESSINGS) IMPLANT
DRAPE FEMORAL ANGIO W/ POUCH (DRAPES) IMPLANT
KIT FEMORAL DEL DENALI (Miscellaneous) IMPLANT
NEEDLE ENTRY 21GA 7CM ECHOTIP (NEEDLE) IMPLANT
PACK ANGIOGRAPHY (CUSTOM PROCEDURE TRAY) IMPLANT
SET INTRO CAPELLA COAXIAL (SET/KITS/TRAYS/PACK) IMPLANT
SHEATH BRITE TIP 8FRX11 (SHEATH) IMPLANT

## 2017-08-27 NOTE — H&P (Signed)
[facility] History & Physical Update  The patient was interviewed and re-examined.  The patient's previous History and Physical has been reviewed and is unchanged.  There is no change in the plan of care. We plan to proceed with the scheduled procedure.  Levora Dredge, MD  08/27/2017, 1:42 PM

## 2017-08-27 NOTE — Progress Notes (Addendum)
ANTICOAGULATION CONSULT NOTE - Initial Consult  Pharmacy Consult for heparin Indication: VTE treatment  No Known Allergies  Patient Measurements: Height:  (162.6 cm) Weight: 120 lb (54.4 kg) IBW/kg (Calculated) : 54.7 Heparin Dosing Weight: 54.5 kg (visual estimate by 2 RN)  Vital Signs: Temp: 97.8 F (36.6 C) (09/25 1431) Temp Source: Oral (09/25 1431) BP: 139/87 (09/25 1701) Pulse Rate: 127 (09/25 1701)  Labs:  Recent Labs  08/27/17 1444  CREATININE 0.84    Estimated Creatinine Clearance: 65.8 mL/min (by C-G formula based on SCr of 0.84 mg/dL).   Medical History: Past Medical History:  Diagnosis Date  . Aphasia   . CVA (cerebral vascular accident) (HCC)   . Diabetes mellitus without complication (HCC)   . GERD (gastroesophageal reflux disease)   . Hemiplegia (HCC)   . Hypertension   . Lack of coordination   . Vertigo     Medications:  Infusions:  . sodium chloride    . heparin      Assessment: 56 yof cc LLE DVT with PMH CVA and left hemiplegia, aphasia, HTN. Presents to hospital for LLE thrombectomy of recent DVT extending from popliteal to femoral vein. Pharmacy consulted to dose heparin for DVT. PTA list includes Xarelto, will confirm last dose time and check baseline HL. Note, entered weight is an estimate based on 2 RN visualization. Cath lab unable to weigh patient.   Goal of Therapy:  Heparin level 0.3-0.7 units/ml Monitor platelets by anticoagulation protocol: Yes   Plan:  Give 4000 units bolus x 1 Start heparin infusion at 1000 units/hr Check anti-Xa level in 6 hours and daily while on heparin Continue to monitor H&H and platelets  9/25 18:15 Cath lab calls back to update weigh based off last weight at [facility] 07/26/17 = 129.8 lb. Heparin rates updated. [facility] RN Crystal confirms last dose of Xarelto was 08/22/2017.   Carola Frost, Pharm.D., BCPS Clinical Pharmacist 08/27/2017,6:06 PM

## 2017-08-27 NOTE — Consult Note (Signed)
[facility] - [place] at [facility]   PATIENT NAME: [name]    MR#:  [id number]  DATE OF BIRTH:  12/13/1960  DATE OF ADMISSION:  08/27/2017  PRIMARY CARE PHYSICIAN: Derwood Kaplan, MD   CONSULT REQUESTING/REFERRING PHYSICIAN: Dr. Gilda Crease  REASON FOR CONSULT: Tachycardia  CHIEF COMPLAINT:  No chief complaint on file.  LLE DVT  HISTORY OF PRESENT ILLNESS:  [name]  is a 56 y.o. female with a known history of CVA with left hemiplegia, aphasia, hypertension presented to the hospital for left lower extremity thrombectomy for recently diagnosed DVT extending from popliteal to femoral vein. Patient was found to be in tachycardia up to 160s along with agitation. Patient speaks [place] and unfortunately is aphasic with limited use of an interpreter and also complicated the situation. She received a dose of worsened and fentanyl after which patient did improve. Procedure was canceled and patient is being admitted to the hospital for further evaluation of the tachycardia on a heparin drip. She is on Xarelto at home but has been held at this time. She takes Lantus 8 units at the nursing home at bedtime daily.  PAST MEDICAL HISTORY:   Past Medical History:  Diagnosis Date  . Aphasia   . CVA (cerebral vascular accident) (HCC)   . Diabetes mellitus without complication (HCC)   . GERD (gastroesophageal reflux disease)   . Hemiplegia (HCC)   . Hypertension   . Lack of coordination   . Vertigo     PAST SURGICAL HISTOIRY:  History reviewed. No pertinent surgical history.  SOCIAL HISTORY:   Social History  Substance Use Topics  . Smoking status: Never Smoker  . Smokeless tobacco: Never Used  . Alcohol use No    FAMILY HISTORY:  History reviewed. No pertinent family history. Unobtainable due to patient's aphasia DRUG ALLERGIES:  No Known Allergies  REVIEW OF SYSTEMS:   Review of Systems  Unable to perform ROS: Patient nonverbal      MEDICATIONS  AT HOME:   Prior to Admission medications   Medication Sig Start Date End Date Taking? Authorizing Provider  Acetaminophen-Codeine (TYLENOL/CODEINE #3) 300-30 MG tablet Take 1 tablet by mouth every 6 (six) hours as needed for pain.   Yes [provider]  amLODipine (NORVASC) 10 MG tablet Take 10 mg by mouth at bedtime.   Yes [provider]  aspirin 81 MG chewable tablet Chew by mouth daily.   Yes [provider]  bisacodyl (DULCOLAX) 10 MG suppository Place 10 mg rectally daily as needed for moderate constipation.   Yes [provider]  bisacodyl (FLEET) 10 MG/30ML ENEM Place 10 mg rectally daily as needed.   Yes [provider]  glimepiride (AMARYL) 2 MG tablet Take 2 mg by mouth daily with breakfast.   Yes [provider]  insulin glargine (LANTUS) 100 UNIT/ML injection Inject 8 Units into the skin at bedtime.   Yes [provider]  lisinopril (PRINIVIL,ZESTRIL) 40 MG tablet Take 40 mg by mouth daily.   Yes [provider]  magnesium oxide (MAG-OX) 400 MG tablet Take 400 mg by mouth daily.   Yes [provider]  Menthol, Topical Analgesic, (BIOFREEZE) 4 % GEL Apply 1 application topically every 8 (eight) hours as needed. Apply to left arm, shoulder, and elbow   Yes [provider]  metoprolol succinate (TOPROL-XL) 100 MG 24 hr tablet Take 100 mg by mouth daily. Take with or immediately following a meal.   Yes [provider]  Multiple Vitamin (MULTIVITAMIN) capsule Take 1 capsule by mouth daily.   Yes [provider]  omeprazole (PRILOSEC) 2 mg/mL SUSP Place 10 mg into feeding tube daily.   Yes [provider]  polyethylene glycol (MIRALAX / GLYCOLAX) packet Take 17 g by mouth at bedtime.   Yes [provider]  cloNIDine (CATAPRES - DOSED IN MG/24 HR) 0.3 mg/24hr patch Place 0.3 mg onto the skin once a week.    [provider]  doxycycline (VIBRAMYCIN) 100 MG  capsule Take 100 mg by mouth 2 (two) times daily.    [provider]  insulin aspart (NOVOLOG) 100 UNIT/ML injection Inject into the skin as needed for high blood sugar.    [provider]  rivaroxaban (XARELTO) 20 MG TABS tablet Take 20 mg by mouth daily with supper.    [provider]      VITAL SIGNS:  Blood pressure 139/87, pulse (!) 127, temperature 97.8 F (36.6 C), temperature source Oral, resp. rate 17, height  (1.626 m), SpO2 95 %.  PHYSICAL EXAMINATION:  GENERAL:  56 y.o.-year-old patient lying in the bed with no acute distress.  EYES: Pupils equal, round, reactive to light and accommodation. No scleral icterus. Extraocular muscles intact.  HEENT: Head atraumatic, normocephalic. Oropharynx and nasopharynx clear.  NECK:  Supple, no jugular venous distention. No thyroid enlargement, no tenderness.  LUNGS: Normal breath sounds bilaterally, no wheezing, rales,rhonchi or crepitation. No use of accessory muscles of respiration.  CARDIOVASCULAR: S1, S2 normal. No murmurs, rubs, or gallops.  ABDOMEN: Soft, nontender, nondistended. Bowel sounds present. No organomegaly or mass.  EXTREMITIES: left lower extremity edema NEUROLOGIC: left hemiplegia. PSYCHIATRIC: The patient is alert and awake SKIN: No obvious rash, lesion, or ulcer.   LABORATORY PANEL:   CBC No results for input(s): WBC, HGB, HCT, PLT in the last 168 hours. ------------------------------------------------------------------------------------------------------------------  Chemistries   Recent Labs Lab 08/27/17 1444  BUN 17  CREATININE 0.84   ------------------------------------------------------------------------------------------------------------------  Cardiac Enzymes No results for input(s): TROPONINI in the last 168 hours. ------------------------------------------------------------------------------------------------------------------  RADIOLOGY:  No results  found.  EKG:   Orders placed or performed in visit on 06/05/12  . EKG 12-Lead    IMPRESSION AND PLAN:   * Sinus tachycardia likely due to agitation Improved with Versed and fentanyl. We will give her Toprol-XL 100 mg that she takes at home and monitor. Start IV fluids.  * left lower extremity DVT Xarelto held. On heparin drip. Left lower extremity thrombectomy tomorrow  * hypertension. Continue Toprol. Hold amlodipine and lisinopril.  * insulin-dependent diabetes mellitus. Reduced dose of Lantus. Sliding scale insulin added.  * history of CVA with aphasia and left-sided weakness. On aspirin and statin.  All the records are reviewed and case discussed with Consulting provider.  TOTAL TIME TAKING CARE OF THIS PATIENT: 35 minutes.   Milagros Loll R M.D on 08/27/2017 at 5:54 PM  Between 7am to 6pm - Pager - [phone]  After 6pm go to [email or web address] - password [email or web address] [facility]  [facility] [place] [facility]  Office  [phone]  CC: Primary care Physician: Derwood Kaplan, MD   Note: This dictation was prepared with Dragon dictation along with smaller phrase technology. Any transcriptional errors that result from this process are unintentional.

## 2017-08-27 NOTE — Op Note (Signed)
[facility]  Percutaneous Study/Intervention Procedural Note   Date of Surgery: 08/27/2017,5:10 PM  Surgeon:Schnier, Latina Craver   Pre-operative Diagnosis: symptomatic left lower extremity DVT  Post-operative diagnosis:  Same; tachycardia; agitation  Procedure(s) Performed:  1.  Conscious sedation   Anesthesia: Conscious sedation was administered by the interventional radiology RN under my direct supervision. IV Versed plus fentanyl were utilized. Continuous ECG, pulse oximetry and blood pressure was monitored throughout the entire procedure. Conscious sedation was administered for a total of 28 minutes.  Sheath: none  Contrast: none cc   Fluoroscopy Time: none minutes  Indications:  The patient has a very swollen and painful left lower extremity. Noninvasive studies demonstrate extensive DVT. She is therefore presenting to undergo thrombectomy.  Procedure:  Ashley Vannasaengis a 56 y.o. female who was identified and appropriate procedural time out was performed.  The patient was then placed supine on the table however, once cardiac monitors were booked up she was noted to have a heart rate ranging from 140 bpm to 170 bpm. She was extremely agitated. In spite of 3 mg of Versed +100 g of fentanyl administered intravenously these symptoms did not improve. It was clear that it would not be appropriate to move forward with the procedure at this time. I therefore terminated the procedure and will admit her place her on a heparin drip and make plans to move forward with the case tomorrow under general anesthesia after medicine evaluates her tachycardia   Disposition: Patient was taken to the recovery room in stable condition having tolerated the procedure well.  Earl Lites Schnier 08/27/2017,5:10 PM

## 2017-08-27 NOTE — Progress Notes (Addendum)
RN Aurther Loft notified - need weight for heparin dosing. She says she will update weight.   08/27/17 18:04 - Aurther Loft and additional RN estimate weight to be about 120 lb. They will call facility to confirm, and they will have floor RN weigh when patient gets to a room.

## 2017-08-27 NOTE — H&P (Signed)
[facility] Admission History & Physical  MRN : [id number]  [name] is a 56 y.o. ([birth year]) female who presents with chief complaint of left leg pain associated with massive DVT.  History of Present Illness: the patient was initially seen in my office by the PA for evaluation of left lower extremity DVT. The encounter date was 20 September, 2018. At that time her son reported she had 2 weeks of left lower extremity swelling.Over the course of that time. The swelling seemed to be increasing and her leg was much more painful. This prompted a duplex ultrasound which demonstrated extensive left lower extremity deep vein thrombosis extending from the popliteal to the common femoral.  At that time the son communicated with his mother and she denied shortness of breath or chest pains.  Today she was being prepared for left lower extremity thrombectomy  But was found to have significant tachycardia with heart rates up to 170 bpm. She was also very agitated to the point that a procedure could not be safely performed. There are significant communication difficulties as the patient is from [place] and does not speak [place]. Attempts at using a Tresa Endo interpreter did not help the situation.  Further complicating the situation is the fact that the patient is a phasic secondary to a CVA she is also hemiplegic. I therefore elected to delay the procedure I will bring her into the hospital to further evaluate her tachycardia. Also arrange for general anesthesia for the procedure.  Current Facility-Administered Medications  Medication Dose Route Frequency Provider Last Rate Last Dose  . dextrose 5 %-0.9 % sodium chloride infusion   Intravenous Continuous Schnier, Latina Craver, MD      . docusate sodium (COLACE) capsule 100 mg  100 mg Oral BID Schnier, Latina Craver, MD      . HYDROcodone-acetaminophen (NORCO/VICODIN) 5-325 MG per tablet 1-2 tablet  1-2 tablet Oral Q4H PRN Schnier, Latina Craver, MD       . magnesium hydroxide (MILK OF MAGNESIA) suspension 30 mL  30 mL Oral Daily PRN Schnier, Latina Craver, MD      . ondansetron [facility]) tablet 4 mg  4 mg Oral Q6H PRN Schnier, Latina Craver, MD       Or  . ondansetron (ZOFRAN) injection 4 mg  4 mg Intravenous Q6H PRN Schnier, Latina Craver, MD      . sodium phosphate (FLEET) 7-19 GM/118ML enema 1 enema  1 enema Rectal Once PRN Schnier, Latina Craver, MD      . sorbitol 70 % solution 30 mL  30 mL Oral Daily PRN Schnier, Latina Craver, MD        Past Medical History:  Diagnosis Date  . Aphasia   . CVA (cerebral vascular accident) (HCC)   . Diabetes mellitus without complication (HCC)   . GERD (gastroesophageal reflux disease)   . Hemiplegia (HCC)   . Hypertension   . Lack of coordination   . Vertigo     History reviewed. No pertinent surgical history.  Social History Social History  Substance Use Topics  . Smoking status: Never Smoker  . Smokeless tobacco: Never Used  . Alcohol use No    Family History History reviewed. No pertinent family history. No family history of bleeding/clotting disorders, porphyria or autoimmune disease   No Known Allergies   REVIEW OF SYSTEMS (Negative unless checked)  Constitutional: Weight loss  Fever  Chills Cardiac: Chest pain   Chest pressure   Palpitations   Shortness of breath  hen laying flat   [] Shortness of breath at rest   Shortness of breath with exertion. Vascular:  Pain in legs with walking   Pain in legs at rest   Pain in legs when laying flat   Claudication   Pain in feet when walking  Pain in feet at rest  Pain in feet when laying flat   History of DVT   Phlebitis   Swelling in legs   Varicose veins   Non-healing ulcers Pulmonary:   Uses home oxygen   Productive cough   Hemoptysis   Wheeze  COPD   Asthma Neurologic:  Dizziness  Blackouts   Seizures   History of stroke   History of TIA  Aphasia   Temporary blindness   Dysphagia    Weakness or numbness in arms   Weakness or numbness in legs Musculoskeletal:  Arthritis   Joint swelling   Joint pain   Low back pain Hematologic:  Easy bruising  Easy bleeding   Hypercoagulable state   Anemic  Hepatitis Gastrointestinal:  Blood in stool   Vomiting blood  Gastroesophageal reflux/heartburn   Difficulty swallowing. Genitourinary:  Chronic kidney disease   Difficult urination  Frequent urination  Burning with urination   Blood in urine Skin:  Rashes   Ulcers   Wounds Psychological:  History of anxiety    History of major depression.  Physical Examination  Vitals:   08/27/17 1431 08/27/17 1638 08/27/17 1651 08/27/17 1701  BP: (!) 156/102   139/87  Pulse: (!) 116   (!) 127  Resp: (!) 23   17  Temp: 97.8 F (36.6 C)     TempSrc: Oral     SpO2: 98% 95% 95% 95%  Height:  (1.626 m)      There is no height or weight on file to calculate BMI. Gen: WD/WN, NAD Head: [place]/AT, No temporalis wasting. Prominent temp pulse not noted. Ear/Nose/Throat: Hearing grossly intact, nares w/o erythema or drainage, oropharynx w/o Erythema/Exudate,  Eyes: Conjunctiva clear, sclera non-icteric Neck: Trachea midline.  No JVD.  Pulmonary:  Good air movement, respirations not labored, no use of accessory muscles.  Cardiac: RRR, normal S1, S2. Vascular: left leg shows massive edema it is tender to palpation there is marked cyanosis of the toes. Vessel Right Left  Radial Palpable Palpable  Aorta Not palpable N/A  Femoral Palpable Palpable  Popliteal Palpable Trace Palpable  PT Palpable Trace Palpable  DP Palpable Trace Palpable   Gastrointestinal: soft, non-tender/non-distended. No guarding/reflex.  Musculoskeletal: M/S 5/5 throughout right side hemiparesis left side.   No deformity or atrophy.  Neurologic: Sensation grossly intact in extremities.  Symmetrical.  Speech is a basic. Motor exam as listed above. Psychiatric: Judgment  indeterminate, Mood & affect appropriate for pt's clinical situation. Dermatologic: No rashes or ulcers noted.  No cellulitis or open wounds. Lymph : No Cervical, Axillary, or Inguinal lymphadenopathy.     CBC Lab Results  Component Value Date   WBC 11.5 (H) 07/01/2012   HGB 13.6 07/01/2012   HCT 40.7 07/01/2012   MCV 87 07/01/2012   PLT 844 (H) 07/01/2012    BMET    Component Value Date/Time   NA 136 06/28/2012 0523   K 4.3 06/28/2012 0523   CL 100 06/28/2012 0523   CO2 26 06/28/2012 0523   GLUCOSE 136 (H) 06/28/2012 0523   BUN 17 08/27/2017 1444   BUN 20 (H) 06/28/2012 0523   CREATININE 0.84 08/27/2017 1444   CREATININE 1.01 06/28/2012 0523  CALCIUM 8.8 06/28/2012 0523   GFRNONAA >60 08/27/2017 1444   GFRNONAA >60 06/28/2012 0523   GFRAA >60 08/27/2017 1444   GFRAA >60 06/28/2012 0523   CrCl cannot be calculated (Unknown ideal weight.).  COAG Lab Results  Component Value Date   INR 0.9 06/05/2012    Radiology No results found.   Assessment/Plan 1. Acute deep vein thrombosis (DVT) of left lower extremity, unspecified vein (HCC) - New Patient presents with what seems like a unprovoked DVT to the left lower extremity deep and superficial system. He patient will be admitted the tachycardia will be evaluated and anesthesia will be involved. Hopefully we will be able to safely proceed tomorrow with mechanical trombectomy and IVC filter placement. In the meantime heparin drip will be initiated. As a result of held. I have discussed this by phone with the son and he is in agreement.  2. Type 2 diabetes mellitus with complication, unspecified whether long term insulin use (HCC) Encouraged good control as its slows the progression of atherosclerotic disease  3. Essential hypertension  Encouraged good control as its slows the progression of atherosclerotic disease  4.  Tachycardia I have contacted the hospitalist service. I appreciate their assistance in evaluating  her tachycardia. Pending their results tentatively we can move forward tomorrow.  No current outpatient prescriptions on file prior to visit.   No current facility-administered medications on file prior to visit.      Levora Dredge, MD  08/27/2017 5:24 PM

## 2017-08-28 ENCOUNTER — Encounter: Payer: Self-pay | Admitting: Vascular Surgery

## 2017-08-28 ENCOUNTER — Inpatient Hospital Stay: Payer: Medicaid Other | Admitting: Certified Registered"

## 2017-08-28 ENCOUNTER — Encounter
Admission: RE | Disposition: A | Discharge status: Skilled Nursing Facility | Payer: Self-pay | Source: Ambulatory Visit | Attending: Vascular Surgery

## 2017-08-28 DIAGNOSIS — I82402 Acute embolism and thrombosis of unspecified deep veins of left lower extremity: Secondary | ICD-10-CM

## 2017-08-28 HISTORY — PX: IVC FILTER INSERTION: CATH118245

## 2017-08-28 HISTORY — PX: PERIPHERAL VASCULAR THROMBECTOMY: CATH118306

## 2017-08-28 LAB — APTT
aPTT: >160 seconds (ref 24–36)
aPTT: 144 seconds — ABNORMAL HIGH (ref 24–36)

## 2017-08-28 LAB — BASIC METABOLIC PANEL
Anion gap: 8 (ref 5–15)
BUN: 15 mg/dL (ref 6–20)
CHLORIDE: 105 mmol/L (ref 101–111)
CO2: 26 mmol/L (ref 22–32)
CREATININE: 0.49 mg/dL (ref 0.44–1.00)
Calcium: 9 mg/dL (ref 8.9–10.3)
GFR calc Af Amer: >60 mL/min (ref >60)
GFR calc non Af Amer: >60 mL/min (ref >60)
Glucose, Bld: 123 mg/dL — ABNORMAL HIGH (ref 65–99)
POTASSIUM: 3.7 mmol/L (ref 3.5–5.1)
Sodium: 139 mmol/L (ref 135–145)

## 2017-08-28 LAB — HEPARIN LEVEL (UNFRACTIONATED)
Heparin Unfractionated: 0.91 IU/mL — ABNORMAL HIGH (ref 0.30–0.70)
Heparin Unfractionated: 0.48 IU/mL (ref 0.30–0.70)

## 2017-08-28 LAB — CBC
HEMATOCRIT: 38.9 % (ref 35.0–47.0)
Hemoglobin: 13.3 g/dL (ref 12.0–16.0)
MCH: 30.9 pg (ref 26.0–34.0)
MCHC: 34.2 g/dL (ref 32.0–36.0)
MCV: 90.5 fL (ref 80.0–100.0)
PLATELETS: 200 10*3/uL (ref 150–440)
RBC: 4.3 MIL/uL (ref 3.80–5.20)
RDW: 13.1 % (ref 11.5–14.5)
WBC: 6.4 10*3/uL (ref 3.6–11.0)

## 2017-08-28 LAB — MRSA PCR SCREENING: MRSA by PCR: NEGATIVE

## 2017-08-28 LAB — GLUCOSE, CAPILLARY
GLUCOSE-CAPILLARY: 158 mg/dL — AB (ref 65–99)
GLUCOSE-CAPILLARY: 115 mg/dL — AB (ref 65–99)
GLUCOSE-CAPILLARY: 136 mg/dL — AB (ref 65–99)

## 2017-08-28 SURGERY — PERIPHERAL VASCULAR THROMBECTOMY
Anesthesia: General

## 2017-08-28 MED ORDER — LIDOCAINE HCL (PF) 1 % IJ SOLN
INTRAMUSCULAR | Status: AC
  Filled 2017-08-28: qty 30

## 2017-08-28 MED ORDER — ALTEPLASE 2 MG IJ SOLR
INTRAMUSCULAR | Status: DC | PRN
  Administered 2017-08-28: 10 mg

## 2017-08-28 MED ORDER — FREE WATER: 150.0000 mL | Status: DC

## 2017-08-28 MED ORDER — GLYCOPYRROLATE 0.2 MG/ML IJ SOLN
INTRAMUSCULAR | Status: DC | PRN
  Administered 2017-08-28: 0.2 mg via INTRAVENOUS

## 2017-08-28 MED ORDER — ONDANSETRON HCL 4 MG/2ML IJ SOLN
INTRAMUSCULAR | Status: DC | PRN
  Administered 2017-08-28: 4 mg via INTRAVENOUS

## 2017-08-28 MED ORDER — FENTANYL CITRATE (PF) 100 MCG/2ML IJ SOLN
INTRAMUSCULAR | Status: AC
  Filled 2017-08-28: qty 2

## 2017-08-28 MED ORDER — HEPARIN (PORCINE) IN NACL 100-0.45 UNIT/ML-% IJ SOLN
850.0000 [IU]/h | INTRAMUSCULAR | Status: DC
  Administered 2017-08-28: 850 [IU]/h via INTRAVENOUS

## 2017-08-28 MED ORDER — ORAL CARE MOUTH RINSE
15.0000 mL | Freq: Two times a day (BID) | OROMUCOSAL | Status: DC
  Administered 2017-08-28 – 2017-08-29 (×2): 15 mL via OROMUCOSAL

## 2017-08-28 MED ORDER — CHLORHEXIDINE GLUCONATE 0.12 % MT SOLN
15.0000 mL | Freq: Two times a day (BID) | OROMUCOSAL | Status: DC
  Administered 2017-08-29: 15 mL via OROMUCOSAL
  Filled 2017-08-28 (×5): qty 15

## 2017-08-28 MED ORDER — PHENYLEPHRINE HCL 10 MG/ML IJ SOLN
INTRAMUSCULAR | Status: DC | PRN
  Administered 2017-08-28 (×2): 100 ug via INTRAVENOUS

## 2017-08-28 MED ORDER — FREE WATER
150.0000 mL | Freq: Four times a day (QID) | Status: DC
  Administered 2017-08-29 (×3): 150 mL

## 2017-08-28 MED ORDER — PROPOFOL 10 MG/ML IV BOLUS
INTRAVENOUS | Status: DC | PRN
  Administered 2017-08-28: 120 mg via INTRAVENOUS
  Administered 2017-08-28: 60 mg via INTRAVENOUS

## 2017-08-28 MED ORDER — ONDANSETRON HCL 4 MG/2ML IJ SOLN: 4.0000 mg | Freq: Once | INTRAMUSCULAR | Status: DC | PRN

## 2017-08-28 MED ORDER — FENTANYL CITRATE (PF) 100 MCG/2ML IJ SOLN
INTRAMUSCULAR | Status: AC
  Administered 2017-08-28: 25 ug via INTRAVENOUS
  Filled 2017-08-28: qty 2

## 2017-08-28 MED ORDER — GLYCOPYRROLATE 0.2 MG/ML IJ SOLN
INTRAMUSCULAR | Status: AC
  Filled 2017-08-28: qty 1

## 2017-08-28 MED ORDER — LIDOCAINE HCL (CARDIAC) 20 MG/ML IV SOLN
INTRAVENOUS | Status: DC | PRN
  Administered 2017-08-28: 50 mg via INTRAVENOUS

## 2017-08-28 MED ORDER — FENTANYL CITRATE (PF) 100 MCG/2ML IJ SOLN
25.0000 ug | INTRAMUSCULAR | Status: DC | PRN
  Administered 2017-08-28 (×3): 25 ug via INTRAVENOUS

## 2017-08-28 MED ORDER — HEPARIN (PORCINE) IN NACL 2-0.9 UNIT/ML-% IJ SOLN
INTRAMUSCULAR | Status: AC
  Filled 2017-08-28: qty 1000

## 2017-08-28 MED ORDER — GLUCERNA 1.5 CAL PO LIQD
237.0000 mL | Freq: Every day | ORAL | Status: DC
  Administered 2017-08-28 – 2017-08-29 (×4): 237 mL

## 2017-08-28 MED ORDER — JEVITY 1.2 CAL PO LIQD: 1000.0000 mL | ORAL | Status: DC

## 2017-08-28 MED ORDER — FENTANYL CITRATE (PF) 100 MCG/2ML IJ SOLN
INTRAMUSCULAR | Status: DC | PRN
  Administered 2017-08-28 (×2): 25 ug via INTRAVENOUS

## 2017-08-28 MED ORDER — IOPAMIDOL (ISOVUE-300) INJECTION 61%
INTRAVENOUS | Status: DC | PRN
  Administered 2017-08-28: 35 mL via INTRAVENOUS

## 2017-08-28 MED ORDER — ONDANSETRON HCL 4 MG/2ML IJ SOLN
INTRAMUSCULAR | Status: AC
  Filled 2017-08-28: qty 2

## 2017-08-28 MED ORDER — PROPOFOL 500 MG/50ML IV EMUL
INTRAVENOUS | Status: AC
  Filled 2017-08-28: qty 50

## 2017-08-28 MED ORDER — LIDOCAINE HCL (PF) 2 % IJ SOLN
INTRAMUSCULAR | Status: AC
  Filled 2017-08-28: qty 4

## 2017-08-28 MED ORDER — ALTEPLASE 2 MG IJ SOLR
INTRAMUSCULAR | Status: AC
  Filled 2017-08-28: qty 14

## 2017-08-28 MED ORDER — HEPARIN (PORCINE) IN NACL 100-0.45 UNIT/ML-% IJ SOLN
850.0000 [IU]/h | INTRAMUSCULAR | Status: DC
  Administered 2017-08-28: 850 [IU]/h via INTRAVENOUS
  Filled 2017-08-28: qty 250

## 2017-08-28 SURGICAL SUPPLY — 22 items
BALLN DORADO 8X100X80 (BALLOONS) ×4
BALLN LUTONIX AV 10X60X75 (BALLOONS) ×8
BALLOON DORADO 8X100X80 (BALLOONS) ×2 IMPLANT
BALLOON LUTONIX AV 10X60X75 (BALLOONS) ×4 IMPLANT
CANISTER PENUMBRA MAX (MISCELLANEOUS) ×4 IMPLANT
CATH BEACON 5 .035 40 KMP TP (CATHETERS) ×2 IMPLANT
CATH BEACON 5 .038 40 KMP TP (CATHETERS) ×2
CATH INDIGO D 50CM (CATHETERS) ×4 IMPLANT
CATH INFUS 90CMX10CM (CATHETERS) ×4 IMPLANT
DEVICE PRESTO INFLATION (MISCELLANEOUS) ×4 IMPLANT
DEVICE SAFEGUARD 24CM (GAUZE/BANDAGES/DRESSINGS) ×4 IMPLANT
DRAPE INCISE IOBAN 66X45 STRL (DRAPES) ×4 IMPLANT
GLIDEWIRE STIFF .35X180X3 HYDR (WIRE) ×4 IMPLANT
KIT FEMORAL DEL DENALI (Miscellaneous) ×4 IMPLANT
NEEDLE ENTRY 21GA 7CM ECHOTIP (NEEDLE) ×4 IMPLANT
PACK ANGIOGRAPHY (CUSTOM PROCEDURE TRAY) ×4 IMPLANT
SET INTRO CAPELLA COAXIAL (SET/KITS/TRAYS/PACK) ×8 IMPLANT
SHEATH BRITE TIP 5FRX11 (SHEATH) ×4 IMPLANT
SHEATH BRITE TIP 8FRX11 (SHEATH) ×8 IMPLANT
TOWEL OR 17X26 4PK STRL BLUE (TOWEL DISPOSABLE) ×4 IMPLANT
WIRE J 3MM .035X145CM (WIRE) ×4 IMPLANT
WIRE MAGIC TORQUE 260C (WIRE) ×4 IMPLANT

## 2017-08-28 NOTE — Op Note (Signed)
[facility]                                                                               OPERATIVE NOTE    PRE-OPERATIVE DIAGNOSIS: Symptomatic left leg DVT  POST-OPERATIVE DIAGNOSIS: Same  PROCEDURE: 1. Ultrasound guidance for vascular access to the left superficial femoral vein  2. Catheter placement into the inferior vena cava for placement of IVC filter 3. Inferior venacavogram 4. Placement of a Select IVC filter infrarenal 5. Infusion thrombolysis with 16 mg of TPA  6.Mechanical thrombectomy of the left external iliac and common femoral vein using the Cat 8 penumbra catheter 7. percutaneous transluminal angioplasty to 10 mm using Lutonix drug-eluting balloons left external iliac vein  SURGEON: Hortencia Pilar  ASSISTANT(S): None  ANESTHESIA: Conscious sedation was administered by the interventional radiology RN under my direct supervision. IV Versed plus fentanyl were utilized. Continuous ECG, pulse oximetry and blood pressure was monitored throughout the entire procedure.  Conscious sedation was administered for a total of 54 minutes.  ESTIMATED BLOOD LOSS: minimal  Contrast: 35  Fluoroscopy time: 4.6  FINDING(S): 1. Patent IVC; thrombus within the left popliteal, superficial femoral vein and common femoral vein  SPECIMEN(S): none  INDICATIONS:  [name] is a 56 y.o. year old female who presents with massive swelling of the left leg quite painful in association with pleuritic chest pains and hypoxia as well as shortness of breath. Inferior vena cava filter is indicated for this reason. Risks and benefits including filter thrombosis, migration, fracture, bleeding, and infection were all discussed. We discussed that all IVC filters that we place can be removed if desired from the patient once the need for the filter has passed.   DESCRIPTION: After  obtaining full informed written consent, the patient was brought back to the vascular suite. The skin was sterilely prepped and draped in a sterile surgical field was created.  Ultrasound was placed in a sterile sleeve.  The eft superficial femoral vein was image with the ultrasound.  It was echolucent and compressible indicating patency.  Image was recorded for the permanent record. The left superficial femoral vein was accessed under direct ultrasound guidance without difficulty with a micropuncture needle and a microwire was advanced without difficulty.   A microsheath was then inserted.  Hand injection of contrast through the micro-sheath demonstrated occlusion of the proximal common femoral and external iliac vein. Using a Glidewire and Kumpe catheter the wire and catheter are negotiated into the inferior vena cava where hand injection contrast demonstrated the IVC was patent and the catheter was intraluminal.   The dilator is passed over the wire and the delivery sheath was placed into the inferior vena cava. Inferior venacavogram was performed. This demonstrated a patent IVC with the level of the renal veins at L1-L2. The filter was  then deployed into the inferior vena cava at the level of inferior margin of L2 just below the renal veins. The delivery sheath was then Exchanged over the wire for an 11 cm 8 [place] pinnacle sheath.  An infusion catheter with a 10 cm infusion length is then prepped on the field and 10 mg of TPA is reconstituted 50 cc. This is then laced throughout the proximal common femoral and external iliac veins. The TPA is then allowed to dwell or approximately 30 minutes. Subsequently, the Cat 8 penumbra catheter is engaged in the aspiration mode and aspiration of the common femoral as well as the external iliac veins is performed multiple passes are made with the volumes recorded in the procedure notes.  Follow-up imaging now demonstrates that almost all the thrombus is been  eliminated from the proximal common femoral vein as well as the external iliac vein. There does  appear to be a stricture/stenosis within the external iliac vein.  This represents a greater than 80% narrowing of the external iliac vein. Therefore, a 8 x 10 Doradoballoon was inflated to 26 atm for 1 minute and subsequently a 10 x 6 lutonix drug-elutingballoon is inflated to 14 atm for 1 minute. Follow-up imaging demonstrates there is less then 5% residual stenosis and now there is rapid flow of contrast through the venous system.    Magnified image of the IVC and filter demonstrates that the IVC and the filter remained widely patent with no evidence of thrombus within the filter. Given that the patient now is widely patent with minimal if any residual thrombus from the common femoral to the IVC the procedure is terminated. The wires removed the sheath is removed pressures held and a pressure dressing with Coban and is then applied. The patient is then returned to the supine position  Interpretation: Initial images demonstrated normal vena cava; filter is placed without difficulty. Initial images the left lower extremity then demonstrated extensive thrombus throughout the common femoral and external iliac veins  Following intervention described above there is near-total resolution of the stricture and essentially complete removal of the thrombus throughout.  COMPLICATIONS: None  CONDITION: Stable  Hortencia Pilar  08/28/2017,4:36 PM

## 2017-08-28 NOTE — Anesthesia Post-op Follow-up Note (Signed)
Anesthesia QCDR form completed.        

## 2017-08-28 NOTE — Anesthesia Procedure Notes (Signed)
Procedure Name: LMA Insertion Date/Time: 08/28/2017 3:18 PM Performed by: Michaele Offer Pre-anesthesia Checklist: Patient identified, Emergency Drugs available, Suction available, Patient being monitored and Timeout performed Patient Re-evaluated:Patient Re-evaluated prior to induction Oxygen Delivery Method: Circle system utilized Preoxygenation: Pre-oxygenation with 100% oxygen Induction Type: IV induction Ventilation: Mask ventilation without difficulty LMA: LMA inserted LMA Size: 4.0 Number of attempts: 1 Placement Confirmation: positive ETCO2 and breath sounds checked- equal and bilateral Tube secured with: Tape Dental Injury: Teeth and Oropharynx as per pre-operative assessment

## 2017-08-28 NOTE — Progress Notes (Signed)
ANTICOAGULATION CONSULT NOTE - Initial Consult  Pharmacy Consult for heparin Indication: VTE treatment  No Known Allergies  Patient Measurements: Height:  (162.6 cm) Weight: 129 lb 8 oz (58.7 kg) IBW/kg (Calculated) : 54.7 Heparin Dosing Weight: 54.5 kg (visual estimate by 2 RN)  Vital Signs: Temp: 98.6 F (37 C) (09/25 2026) Temp Source: Oral (09/25 2026) BP: 138/79 (09/25 2026) Pulse Rate: 100 (09/25 2026)  Labs:  Recent Labs  08/27/17 1444 08/27/17 1909 08/28/17 0057  HGB  --   --  13.3  HCT  --   --  38.9  PLT  --   --  200  APTT  --  >160* >160*  LABPROT  --  15.1  --   INR  --  1.20  --   HEPARINUNFRC  --  1.51* 0.91*  CREATININE 0.84  --  0.49    Estimated Creatinine Clearance: 69.4 mL/min (by C-G formula based on SCr of 0.49 mg/dL).   Medical History: Past Medical History:  Diagnosis Date  . Aphasia   . CVA (cerebral vascular accident) (HCC)   . Diabetes mellitus without complication (HCC)   . GERD (gastroesophageal reflux disease)   . Hemiplegia (HCC)   . Hypertension   . Lack of coordination   . Vertigo     Medications:  Infusions:  . sodium chloride 1,000 mL (08/27/17 1828)  . heparin      Assessment: 56 yof cc LLE DVT with PMH CVA and left hemiplegia, aphasia, HTN. Presents to hospital for LLE thrombectomy of recent DVT extending from popliteal to femoral vein. Pharmacy consulted to dose heparin for DVT. PTA list includes Xarelto, will confirm last dose time and check baseline HL. Note, entered weight is an estimate based on 2 RN visualization. Cath lab unable to weigh patient.   Goal of Therapy:  Heparin level 0.3-0.7 units/ml Monitor platelets by anticoagulation protocol: Yes   Plan:  Give 4000 units bolus x 1 Start heparin infusion at 1000 units/hr Check anti-Xa level in 6 hours and daily while on heparin Continue to monitor H&H and platelets  9/25 18:15 Cath lab calls back to update weigh based off last weight at [facility] 07/26/17 = 129.8 lb. Heparin rates updated. [facility] RN Crystal confirms last dose of Xarelto was 08/22/2017.  9/26 @ 1900 HL 1.51, aPTT >160, @ 0057 HL 0.91, aPTT >160. HLs are coming down but aPTT is consistently >160, as a result will hold heparin drip for 1 hour and will restart rate @ 850 units/hr and will recheck HL/aPTT @ 1100.  Thomasene Ripple, Pharm.D., BCPS Clinical Pharmacist 08/28/2017,3:41 AM

## 2017-08-28 NOTE — Progress Notes (Signed)
Talked to son, Roe Coombs. Attempted to ask admission questions., but unable to at this time.    Update:Attempted to complete admission but patient is nonverbal. Used interpretor (#11000) via [employer] interpreters to communicate plan of care, review medications, and complete assessment. No concerns noted at this time. Will continue to monitor.   Mayra Neer M

## 2017-08-28 NOTE — Progress Notes (Signed)
Patient transferred off floor to specials for procedure

## 2017-08-28 NOTE — Progress Notes (Signed)
Pt currently stable, elevated hr due to normal agitation, ok to proceed to or, consider giving something patient for agitation 1/2 hour prior to procedure.

## 2017-08-28 NOTE — Progress Notes (Signed)
[employer] [facility] [place] [facility]                                                                                                                                                                                  Patient Demographics   [name], is a 56 y.o. female, DOB - 12-Jan-1961, UEA:[id number]  Admit date - 08/27/2017   Admitting Physician Renford Dills, MD  Outpatient Primary MD for the patient is Derwood Kaplan, MD   LOS - 1  Subjective: Patient's nonverbal and unable to communicate currently not agitated and stable    Review of Systems:   CONSTITUTIONAL:unable to provide due to dementia  Vitals:   Vitals:   08/28/17 0500 08/28/17 0751 08/28/17 1148 08/28/17 1321  BP:  133/74 134/70   Pulse:  89 72   Resp:  18 19   Temp:  98.9 F (37.2 C) 99.4 F (37.4 C) 98.7 F (37.1 C)  TempSrc:  Oral Oral Axillary  SpO2:  98% 98%   Weight: 130 lb 8 oz (59.2 kg)     Height:        Wt Readings from Last 3 Encounters:  08/28/17 130 lb 8 oz (59.2 kg)     Intake/Output Summary (Last 24 hours) at 08/28/17 1345 Last data filed at 08/28/17 1330  Gross per 24 hour  Intake          1164.63 ml  Output                0 ml  Net          1164.63 ml    Physical Exam:   GENERAL: Pleasant-appearing in no apparent distress.  HEAD, EYES, EARS, NOSE AND THROAT: Atraumatic, normocephalic. Extraocular muscles are intact. Pupils equal and reactive to light. Sclerae anicteric. No conjunctival injection. No oro-pharyngeal erythema.  NECK: Supple. There is no jugular venous distention. No bruits, no lymphadenopathy, no thyromegaly.  HEART: Regular rate and rhythm,. No murmurs, no rubs, no clicks.  LUNGS: Clear to auscultation bilaterally. No rales or rhonchi. No wheezes.  ABDOMEN: Soft, flat, nontender, nondistended. Has good bowel sounds. No hepatosplenomegaly appreciated.  EXTREMITIES: left lower ext swelling NEUROLOGIC: The patient is alert, awake, and oriented  x3 with no focal motor or sensory deficits appreciated bilaterally.  SKIN: Moist and warm with no rashes appreciated.  Psych: Not anxious, depressed LN: No inguinal LN enlargement    Antibiotics   Anti-infectives    Start     Dose/Rate Route Frequency Ordered Stop   08/27/17 0100  ceFAZolin (ANCEF) IVPB 2g/100 mL premix  Status:  Discontinued     2 g 200 mL/hr over 30  Minutes Intravenous  Once 08/27/17 0054 08/27/17 1723      Medications   Scheduled Meds: . aspirin  81 mg Per Tube Daily  . cloNIDine  0.3 mg Transdermal Weekly  . feeding supplement (GLUCERNA 1.5 CAL)  237 mL Per Tube 5 X Daily  . free water  150 mL Per Tube Q6H  . insulin aspart  0-9 Units Subcutaneous TID WC  . insulin glargine  4 Units Subcutaneous QHS  . metoprolol tartrate  50 mg Per Tube BID  . polyethylene glycol  17 g Per Tube QHS   Continuous Infusions: . sodium chloride 50 mL/hr at 08/28/17 1031  . heparin 850 Units/hr (08/28/17 0515)   PRN Meds:.bisacodyl, HYDROcodone-acetaminophen, magnesium hydroxide, [DISCONTINUED] ondansetron **OR** ondansetron (ZOFRAN) IV, sodium phosphate   Data Review:   Micro Results Recent Results (from the past 240 hour(s))  MRSA PCR Screening     Status: None   Collection Time: 08/28/17 10:22 AM  Result Value Ref Range Status   MRSA by PCR NEGATIVE NEGATIVE Final    Comment:        The GeneXpert MRSA Assay (FDA approved for NASAL specimens only), is one component of a comprehensive MRSA colonization surveillance program. It is not intended to diagnose MRSA infection nor to guide or monitor treatment for MRSA infections.     Radiology Reports No results found.   CBC  Recent Labs Lab 08/28/17 0057  WBC 6.4  HGB 13.3  HCT 38.9  PLT 200  MCV 90.5  MCH 30.9  MCHC 34.2  RDW 13.1    Chemistries   Recent Labs Lab 08/27/17 1444 08/28/17 0057  NA  --  139  K  --  3.7  CL  --  105  CO2  --  26  GLUCOSE  --  123*  BUN 17 15  CREATININE 0.84  0.49  CALCIUM  --  9.0   ------------------------------------------------------------------------------------------------------------------ estimated creatinine clearance is 69.4 mL/min (by C-G formula based on SCr of 0.49 mg/dL). ------------------------------------------------------------------------------------------------------------------  Recent Labs  08/27/17 1444  HGBA1C 7.1*   ------------------------------------------------------------------------------------------------------------------ No results for input(s): CHOL, HDL, LDLCALC, TRIG, CHOLHDL, LDLDIRECT in the last 72 hours. ------------------------------------------------------------------------------------------------------------------ No results for input(s): TSH, T4TOTAL, T3FREE, THYROIDAB in the last 72 hours.  Invalid input(s): FREET3 ------------------------------------------------------------------------------------------------------------------ No results for input(s): VITAMINB12, FOLATE, FERRITIN, TIBC, IRON, RETICCTPCT in the last 72 hours.  Coagulation profile  Recent Labs Lab 08/27/17 1909  INR 1.20    No results for input(s): DDIMER in the last 72 hours.  Cardiac Enzymes No results for input(s): CKMB, TROPONINI, MYOGLOBIN in the last 168 hours.  Invalid input(s): CK ------------------------------------------------------------------------------------------------------------------ Invalid input(s): POCBNP    Assessment & Plan   Pt is 56 year old with history of previous stroke *Sinus tachycardia likely due to agitation Due to agitation Okay to proceed to surgery  * left lower extremity DVT Xarelto held. On heparin drip. Left lower extremity thrombectomy Later today  * hypertension. Continue Toprol.   * insulin-dependent diabetes mellitus. Reduced dose of Lantus. Sliding scale insulin added.  * history of CVA with aphasia and left-sided weakness. On aspirin and statin.  From a  medical standpoint if stable surgically can be discharged home post procedure      Code Status Orders        Start     Ordered   08/27/17 1718  Full code  Continuous     08/27/17 1723    Code Status History    Date Active Date Inactive Code Status  Order ID Comments User Context   This patient has a current code status but no historical code status.               DVT Prophylaxis  heparin  Lab Results  Component Value Date   PLT 200 08/28/2017     Time Spent in minutes    Greater than 50% of time spent in care coordination and counseling patient regarding the condition and plan of care.   Auburn Bilberry M.D on 08/28/2017 at 1:45 PM  Between 7am to 6pm - Pager - [phone]  After 6pm go to [email or web address] - password [email or web address] [facility]  [facility]   Office  [phone]

## 2017-08-28 NOTE — Anesthesia Postprocedure Evaluation (Signed)
Anesthesia Post Note  Patient: [name]  Procedure(s) Performed: Procedure(s) (LRB): PERIPHERAL VASCULAR THROMBECTOMY (Left) IVC FILTER INSERTION (N/A)  Patient location during evaluation: PACU Anesthesia Type: General Level of consciousness: awake and alert Pain management: pain level controlled Vital Signs Assessment: post-procedure vital signs reviewed and stable Respiratory status: spontaneous breathing, nonlabored ventilation and respiratory function stable Cardiovascular status: blood pressure returned to baseline and stable Postop Assessment: no signs of nausea or vomiting Anesthetic complications: no     Last Vitals:  Vitals:   08/28/17 1655 08/28/17 1710  BP: 99/64 94/71  Pulse: 82 (!) 107  Resp: 14   Temp: (!) 36.2 C   SpO2: 98%     Last Pain:  Vitals:   08/28/17 1655  TempSrc:   PainSc: Asleep                 Adisynn Suleiman

## 2017-08-28 NOTE — Progress Notes (Addendum)
APTT >160. No signs of bleeding. MD and Pharmacist made aware. No new orders at this time.   Update: Per Pharmacist, pause heparin drip for 1 hour.   [name]

## 2017-08-28 NOTE — Anesthesia Preprocedure Evaluation (Signed)
Anesthesia Evaluation  Patient identified by MRN, date of birth, ID band Patient awake    Reviewed: Allergy & Precautions, H&P , NPO status , Patient's Chart, lab work & pertinent test results, reviewed documented beta blocker date and time   Airway Mallampati: IV  TM Distance: >3 FB Neck ROM: full    Dental  (+) Teeth Intact   Pulmonary neg pulmonary ROS,    Pulmonary exam normal        Cardiovascular Exercise Tolerance: Good hypertension, + Peripheral Vascular Disease  negative cardio ROS Normal cardiovascular exam Rate:Normal     Neuro/Psych CVA, Residual Symptoms negative neurological ROS  negative psych ROS   GI/Hepatic negative GI ROS, Neg liver ROS, GERD  Medicated,  Endo/Other  negative endocrine ROSdiabetes, Poorly Controlled, Type 2, Oral Hypoglycemic Agents  Renal/GU negative Renal ROS  negative genitourinary   Musculoskeletal   Abdominal   Peds  Hematology negative hematology ROS (+)   Anesthesia Other Findings   Reproductive/Obstetrics negative OB ROS                             Anesthesia Physical Anesthesia Plan  ASA: III  Anesthesia Plan: General LMA   Post-op Pain Management:    Induction:   PONV Risk Score and Plan:   Airway Management Planned:   Additional Equipment:   Intra-op Plan:   Post-operative Plan:   Informed Consent: I have reviewed the patients History and Physical, chart, labs and discussed the procedure including the risks, benefits and alternatives for the proposed anesthesia with the patient or authorized representative who has indicated his/her understanding and acceptance.     Plan Discussed with: CRNA  Anesthesia Plan Comments:         Anesthesia Quick Evaluation

## 2017-08-28 NOTE — Transfer of Care (Signed)
Immediate Anesthesia Transfer of Care Note  Patient: [name]  Procedure(s) Performed: Procedure(s): PERIPHERAL VASCULAR THROMBECTOMY (Left) IVC FILTER INSERTION (N/A)  Patient Location: PACU  Anesthesia Type:General  Level of Consciousness: sedated  Airway & Oxygen Therapy: Patient Spontanous Breathing and Patient connected to face mask oxygen  Post-op Assessment: Report given to RN and Post -op Vital signs reviewed and stable  Post vital signs: Reviewed  Last Vitals:  Vitals:   08/28/17 1321 08/28/17 1655  BP:  99/64  Pulse:  82  Resp:  14  Temp: 37.1 C   SpO2:  98%    Last Pain:  Vitals:   08/28/17 1321  TempSrc: Axillary         Complications: No apparent anesthesia complications

## 2017-08-28 NOTE — Progress Notes (Signed)
ANTICOAGULATION CONSULT NOTE - Initial Consult  Pharmacy Consult for heparin Indication: VTE treatment  No Known Allergies  Patient Measurements: Height:  (162.6 cm) Weight: 130 lb 8 oz (59.2 kg) IBW/kg (Calculated) : 54.7 Heparin Dosing Weight: 54.5 kg (visual estimate by 2 RN)  Vital Signs: Temp: 99.4 F (37.4 C) (09/26 1148) Temp Source: Oral (09/26 1148) BP: 134/70 (09/26 1148) Pulse Rate: 72 (09/26 1148)  Labs:  Recent Labs  08/27/17 1444 08/27/17 1909 08/28/17 0057 08/28/17 1101  HGB  --   --  13.3  --   HCT  --   --  38.9  --   PLT  --   --  200  --   APTT  --  >160* >160* 144*  LABPROT  --  15.1  --   --   INR  --  1.20  --   --   HEPARINUNFRC  --  1.51* 0.91* 0.48  CREATININE 0.84  --  0.49  --     Estimated Creatinine Clearance: 69.4 mL/min (by C-G formula based on SCr of 0.49 mg/dL).   Medical History: Past Medical History:  Diagnosis Date  . Aphasia   . CVA (cerebral vascular accident) (HCC)   . Diabetes mellitus without complication (HCC)   . GERD (gastroesophageal reflux disease)   . Hemiplegia (HCC)   . Hypertension   . Lack of coordination   . Vertigo     Medications:  Infusions:  . sodium chloride 50 mL/hr at 08/28/17 1031  . heparin 850 Units/hr (08/28/17 0515)    Assessment: 56 yof cc LLE DVT with PMH CVA and left hemiplegia, aphasia, HTN. Presents to hospital for LLE thrombectomy of recent DVT extending from popliteal to femoral vein. Pharmacy consulted to dose heparin for DVT. PTA list includes Xarelto, will confirm last dose time and check baseline HL. Note, entered weight is an estimate based on 2 RN visualization. Cath lab unable to weigh patient.   Goal of Therapy:  APTT 66-102s Heparin level 0.3-0.7 units/ml Monitor platelets by anticoagulation protocol: Yes   Plan:  Give 4000 units bolus x 1 Start heparin infusion at 1000 units/hr Check anti-Xa level in 6 hours and daily while on heparin Continue to monitor H&H  and platelets  9/25 18:15 Cath lab calls back to update weigh based off last weight at [facility] 07/26/17 = 129.8 lb. Heparin rates updated. [facility] RN Crystal confirms last dose of Xarelto was 08/22/2017.  9/26 @ 1900 HL 1.51, aPTT >160, @ 0057 HL 0.91, aPTT >160. HLs are coming down but aPTT is consistently >160, as a result will hold heparin drip for 1 hour and will restart rate @ 850 units/hr and will recheck HL/aPTT @ 1100.  9/26 @ 1101 HL 0.48, aPTT 144. HL within range aPTT still elevated. Will continue current rate at this time, and recheck aPTT and HL with next draw.   Cayleb Jarnigan M Rhylin Venters, Pharm.D., BCPS Clinical Pharmacist 08/28/2017,11:55 AM

## 2017-08-28 NOTE — Progress Notes (Signed)
Initial Nutrition Assessment  DOCUMENTATION CODES:   Not applicable  INTERVENTION:   Following surgery today:  Continue TF regimen from [facility] - Glucerna 1.5 bolus 5 times daily   Regimen provides 1780 calories, 98 grams of protein, free water  Recommend 30mL free water flush before and after each feeding  Recommend additional free water flush of 4 times daily, providing free water total   NUTRITION DIAGNOSIS:   Inadequate oral intake related to dysphagia as evidenced by NPO status.  GOAL:   Patient will meet greater than or equal to 90% of their needs  MONITOR:   I & O's, Labs, TF tolerance, Weight trends  REASON FOR ASSESSMENT:   Consult Enteral/tube feeding initiation and management  ASSESSMENT:   [name] is a 56 yo female with PMH of DM, GERD, CVA and Aphasia, Hemiplegia, HTN, presents with LLE pain and swelling, massive DVT.   Spoke with patient's Son and her nurse Marena Chancy from [employer] via phone. Patient is non-verbal unable to provide any history. Patient was taking Glucerna 1.5 bolus 5 times daily PTA with no recent weight loss. Patient normally tolerates feeds, though Marena Chancy states she did become agitated during her last feed prior to admission. Patient does not take anything PO due to dysphagia. Nutrition-Focused physical exam completed. Findings are no fat depletion, no muscle depletion, and moderate edema at LLE.   To go to OR today for mechanical trombectomy and IVC filter placement. Surgery was aborted yesterday when patient was found to be agitated.  Labs reviewed:  CBGs 158, 134, 162  Medications reviewed and include:  Novolog 0-9 Units TID, Lantus 4 Units HS NS at 31mL/hr   Intake/Output Summary (Last 24 hours) at 08/28/17 1226 Last data filed at 08/28/17 1610  Gross per 24 hour  Intake           625.88 ml  Output                0 ml  Net           625.88 ml     Diet Order:   Diet NPO time specified Except for: Sips with Meds  Skin:  Reviewed, no issues  Last BM:  PTA  Height:   Ht Readings from Last 1 Encounters:  08/27/17  (1.626 m)    Weight:   Wt Readings from Last 1 Encounters:  08/28/17 130 lb 8 oz (59.2 kg)    Ideal Body Weight:  54.54 kg  BMI:  Body mass index is 22.4 kg/m.  Estimated Nutritional Needs:   Kcal:  1475-1775 calories  Protein:  77-89 grams (1.3-1.5g/kg)  Fluid:  1.5-1.8L  EDUCATION NEEDS:   No education needs identified at this time  Dionne Ano. Susa Bones, MS, RD LDN Inpatient Clinical Dietitian Pager [phone]

## 2017-08-29 ENCOUNTER — Encounter: Payer: Self-pay | Admitting: Vascular Surgery

## 2017-08-29 DIAGNOSIS — I82402 Acute embolism and thrombosis of unspecified deep veins of left lower extremity: Secondary | ICD-10-CM

## 2017-08-29 LAB — GLUCOSE, CAPILLARY
GLUCOSE-CAPILLARY: 113 mg/dL — AB (ref 65–99)
GLUCOSE-CAPILLARY: 154 mg/dL — AB (ref 65–99)
GLUCOSE-CAPILLARY: 150 mg/dL — AB (ref 65–99)
GLUCOSE-CAPILLARY: 191 mg/dL — AB (ref 65–99)
GLUCOSE-CAPILLARY: 150 mg/dL — AB (ref 65–99)
GLUCOSE-CAPILLARY: 219 mg/dL — AB (ref 65–99)

## 2017-08-29 LAB — HEPARIN LEVEL (UNFRACTIONATED): HEPARIN UNFRACTIONATED: 0.33 [IU]/mL (ref 0.30–0.70)

## 2017-08-29 LAB — APTT: aPTT: 122 seconds — ABNORMAL HIGH (ref 24–36)

## 2017-08-29 MED ORDER — RIVAROXABAN 20 MG PO TABS: 20.0000 mg | ORAL_TABLET | Freq: Every day | ORAL | 0 refills | Status: DC

## 2017-08-29 MED ORDER — RIVAROXABAN 20 MG PO TABS
20.0000 mg | ORAL_TABLET | Freq: Every day | ORAL | Status: DC
  Filled 2017-08-29: qty 1

## 2017-08-29 MED ORDER — RIVAROXABAN 20 MG PO TABS
20.0000 mg | ORAL_TABLET | Freq: Every day | ORAL | Status: DC
  Administered 2017-08-29: 20 mg via ORAL

## 2017-08-29 NOTE — Progress Notes (Signed)
ANTICOAGULATION CONSULT NOTE - Initial Consult  Pharmacy Consult for heparin Indication: VTE treatment  No Known Allergies  Patient Measurements: Height:  (162.6 cm) Weight: 130 lb 8 oz (59.2 kg) IBW/kg (Calculated) : 54.7 Heparin Dosing Weight: 54.5 kg (visual estimate by 2 RN)  Vital Signs: Temp: 97.8 F (36.6 C) (09/26 1916) Temp Source: Oral (09/26 1916) BP: 138/73 (09/26 1916) Pulse Rate: 107 (09/26 1916)  Labs:  Recent Labs  08/27/17 1444  08/27/17 1909 08/28/17 0057 08/28/17 1101 08/29/17 0233  HGB  --   --   --  13.3  --   --   HCT  --   --   --  38.9  --   --   PLT  --   --   --  200  --   --   APTT  --   < > >160* >160* 144* 122*  LABPROT  --   --  15.1  --   --   --   INR  --   --  1.20  --   --   --   HEPARINUNFRC  --   < > 1.51* 0.91* 0.48 0.33  CREATININE 0.84  --   --  0.49  --   --   < > = values in this interval not displayed.  Estimated Creatinine Clearance: 69.4 mL/min (by C-G formula based on SCr of 0.49 mg/dL).   Medical History: Past Medical History:  Diagnosis Date  . Aphasia   . CVA (cerebral vascular accident) (HCC)   . Diabetes mellitus without complication (HCC)   . GERD (gastroesophageal reflux disease)   . Hemiplegia (HCC)   . Hypertension   . Lack of coordination   . Vertigo     Medications:  Infusions:  . sodium chloride 50 mL/hr at 08/28/17 1836  . heparin 850 Units/hr (08/28/17 2013)    Assessment: 56 yof cc LLE DVT with PMH CVA and left hemiplegia, aphasia, HTN. Presents to hospital for LLE thrombectomy of recent DVT extending from popliteal to femoral vein. Pharmacy consulted to dose heparin for DVT. PTA list includes Xarelto, will confirm last dose time and check baseline HL. Note, entered weight is an estimate based on 2 RN visualization. Cath lab unable to weigh patient.   Goal of Therapy:  APTT 66-102s Heparin level 0.3-0.7 units/ml Monitor platelets by anticoagulation protocol: Yes   Plan:  Give 4000  units bolus x 1 Start heparin infusion at 1000 units/hr Check anti-Xa level in 6 hours and daily while on heparin Continue to monitor H&H and platelets  9/25 18:15 Cath lab calls back to update weigh based off last weight at [facility] 07/26/17 = 129.8 lb. Heparin rates updated. [facility] RN Crystal confirms last dose of Xarelto was 08/22/2017.  9/26 @ 1900 HL 1.51, aPTT >160, @ 0057 HL 0.91, aPTT >160. HLs are coming down but aPTT is consistently >160, as a result will hold heparin drip for 1 hour and will restart rate @ 850 units/hr and will recheck HL/aPTT @ 1100.  9/26 @ 1101 HL 0.48, aPTT 144. HL within range aPTT still elevated. Will continue current rate at this time, and recheck aPTT and HL with next draw.   9/27 @ 0233 HL 0.33, aPTT 122. HL therapeutic, aPTT supratherapeutic. Since HL is WNL will dose off of HL. Will continue current rate and will recheck HL/aPTT @ 0830.  Thomasene Ripple, Pharm.D., BCPS Clinical Pharmacist 08/29/2017,3:34 AM

## 2017-08-29 NOTE — Clinical Social Work Note (Signed)
Clinical Social Work Assessment  Patient Details  Name: [name] MRN: [id number] Date of Birth: 1961/08/13  Date of referral:  08/29/17               Reason for consult:  Facility Placement                Permission sought to share information with:  Family Supports, Magazine features editor Permission granted to share information::  Yes, Verbal Permission Granted  Name::     [name]   [phone] or Abigail Miyamoto [phone]   Agency::  SNF admissions  Relationship::     Contact Information:     Housing/Transportation Living arrangements for the past 2 months:  Skilled Building surveyor of Information:  Medical Team, Facility Patient Interpreter Needed:  None Criminal Activity/Legal Involvement Pertinent to Current Situation/Hospitalization:  No - Comment as needed Significant Relationships:  Adult Children Lives with:  Facility Resident Do you feel safe going back to the place where you live?  Yes Need for family participation in patient care:  Yes (Comment)  Care giving concerns:  Patient's family have not contacted CSW to express any concerns about patient returning to [facility].   Social Worker assessment / plan:  Patient is a 56 year old female who is alert and oriented x1.  Patient has a feeding tube, and is a long term care resident at [facility].  Assessment was completed by reviewing patient's chart and speaking with [facility].  Patient is nonverbal, and CSW has left message for patient's son Roe Coombs.  Patient has been living at [facility] for a few years.  Patient is total care and needs assistance with everything.  Patient's family have not been at bedside very much.  Patient is to return back to [facility], patient's family have not expressed any concerns about having patient return back to SNF.   Employment status:  Retired Health and safety inspector:  Medicaid In [place] PT Recommendations:   Not assessed at this time Information / Referral to community resources:  Skilled Nursing Facility  Patient/Family's Response to care: Patient's family did not express anything about returning back to SNF.  Patient/Family's Understanding of and Emotional Response to Diagnosis, Current Treatment, and Prognosis:  Patient has some confusion and not aware of current prognosis.  Emotional Assessment Appearance:  Appears stated age Attitude/Demeanor/Rapport:    Affect (typically observed):  Calm, Appropriate Orientation:  Oriented to Self Alcohol / Substance use:  Not Applicable Psych involvement (Current and /or in the community):  No (Comment)  Discharge Needs  Concerns to be addressed:  Care Coordination Readmission within the last 30 days:  No Current discharge risk:  Cognitively Impaired Barriers to Discharge:  No Barriers Identified   Darleene Cleaver, LCSWA 08/29/2017, 5:26 PM

## 2017-08-29 NOTE — Progress Notes (Signed)
ANTICOAGULATION CONSULT NOTE - Initial Consult  Pharmacy Consult for Xarelto Dosing  Indication: DVT  No Known Allergies  Patient Measurements: Height:  (162.6 cm) Weight: 162 lb 9.6 oz (73.8 kg) IBW/kg (Calculated) : 54.7 Vital Signs: Temp: 98.4 F (36.9 C) (09/27 0835) Temp Source: Oral (09/27 0835) BP: 154/80 (09/27 0835) Pulse Rate: 87 (09/27 0835)  Labs:  Recent Labs  08/27/17 1444  08/27/17 1909 08/28/17 0057 08/28/17 1101 08/29/17 0233  HGB  --   --   --  13.3  --   --   HCT  --   --   --  38.9  --   --   PLT  --   --   --  200  --   --   APTT  --   < > >160* >160* 144* 122*  LABPROT  --   --  15.1  --   --   --   INR  --   --  1.20  --   --   --   HEPARINUNFRC  --   < > 1.51* 0.91* 0.48 0.33  CREATININE 0.84  --   --  0.49  --   --   < > = values in this interval not displayed.  Estimated Creatinine Clearance: 79.1 mL/min (by C-G formula based on SCr of 0.49 mg/dL).   Medical History: Past Medical History:  Diagnosis Date  . Aphasia   . CVA (cerebral vascular accident) (HCC)   . Diabetes mellitus without complication (HCC)   . GERD (gastroesophageal reflux disease)   . Hemiplegia (HCC)   . Hypertension   . Lack of coordination   . Vertigo     Assessment: 56 yo female diagnosised with unprovoked  "Massive DVT" on 9/20 and started on Xarelto outpatient. Patient was admitted on 9/25 and is now S/P peripheral vascular thrombectomy and IVC filter insertion. According to procedure notes there was "essentially complete removal of the thrombus throughout."  Plan:  Since complete removal of thrombus was documented, will start patient on Xarelto  daily.  Gardner Candle, PharmD, BCPS Clinical Pharmacist 08/29/2017 9:14 AM

## 2017-08-29 NOTE — Plan of Care (Signed)
Problem: Education: Goal: Knowledge of [place] [facility] Education information/materials will improve Outcome: Progressing Pt s/p left thrombectomy with IVC filter placement, left leg wrapped in coban, PAD now off, gauze and tegaderm placed over it. Normal saline infusing as well as heparin gtt restarted. Pt incontinent, external catheter placed. PEG tube, giving free water and bolus feeds, works well to gravity. While interpreter was here pt stated no pain.

## 2017-08-29 NOTE — Progress Notes (Signed)
[employer] -  at [facility]                                                                                                                                                                                  Patient Demographics   [name], is a 56 y.o. female, DOB - 03-08-1961, ZOX:[id number]  Admit date - 08/27/2017   Admitting Physician Renford Dills, MD  Outpatient Primary MD for the patient is Derwood Kaplan, MD   LOS - 2  Subjective: Pt nonverbal has no complaints    Review of Systems:   CONSTITUTIONAL:unable to provide due to dementia  Vitals:   Vitals:   08/28/17 1823 08/28/17 1916 08/29/17 0359 08/29/17 0835  BP: 134/71 138/73 132/70 (!) 154/80  Pulse: 88 (!) 107 88 87  Resp: Temp: 97.6 F (36.4 C) 97.8 F (36.6 C) 98 F (36.7 C) 98.4 F (36.9 C)  TempSrc: Oral Oral  Oral  SpO2: 99% 100% 98%   Weight:   162 lb 9.6 oz (73.8 kg)   Height:        Wt Readings from Last 3 Encounters:  08/29/17 162 lb 9.6 oz (73.8 kg)     Intake/Output Summary (Last 24 hours) at 08/29/17 0855 Last data filed at 08/29/17 0700  Gross per 24 hour  Intake          4852.71 ml  Output             2150 ml  Net          2702.71 ml    Physical Exam:   GENERAL: Pleasant-appearing in no apparent distress.  HEAD, EYES, EARS, NOSE AND THROAT: Atraumatic, normocephalic. Extraocular muscles are intact. Pupils equal and reactive to light. Sclerae anicteric. No conjunctival injection. No oro-pharyngeal erythema.  NECK: Supple. There is no jugular venous distention. No bruits, no lymphadenopathy, no thyromegaly.  HEART: Regular rate and rhythm,. No murmurs, no rubs, no clicks.  LUNGS: Clear to auscultation bilaterally. No rales or rhonchi. No wheezes.  ABDOMEN: Soft, flat, nontender, nondistended. Has good bowel sounds. No hepatosplenomegaly appreciated.  EXTREMITIES: left lower ext swelling NEUROLOGIC: The patient is alert, awake, and oriented  x3 with no focal motor or sensory deficits appreciated bilaterally.  SKIN: Moist and warm with no rashes appreciated.  Psych: Not anxious, depressed LN: No inguinal LN enlargement    Antibiotics   Anti-infectives    Start     Dose/Rate Route Frequency Ordered Stop   08/27/17 0100  ceFAZolin (ANCEF) IVPB 2g/100 mL premix  Status:  Discontinued     2 g 200 mL/hr over 30 Minutes Intravenous  Once  08/27/17 0054 08/27/17 1723      Medications   Scheduled Meds: . aspirin  81 mg Per Tube Daily  . chlorhexidine  15 mL Mouth Rinse BID  . cloNIDine  0.3 mg Transdermal Weekly  . feeding supplement (GLUCERNA 1.5 CAL)  237 mL Per Tube 5 X Daily  . free water  150 mL Per Tube Q6H  . insulin aspart  0-9 Units Subcutaneous TID WC  . insulin glargine  4 Units Subcutaneous QHS  . mouth rinse  15 mL Mouth Rinse q12n4p  . metoprolol tartrate  50 mg Per Tube BID  . polyethylene glycol  17 g Per Tube QHS   Continuous Infusions: . sodium chloride 50 mL/hr at 08/28/17 1836  . heparin 850 Units/hr (08/28/17 2013)   PRN Meds:.bisacodyl, HYDROcodone-acetaminophen, magnesium hydroxide, [DISCONTINUED] ondansetron **OR** ondansetron (ZOFRAN) IV, sodium phosphate   Data Review:   Micro Results Recent Results (from the past 240 hour(s))  MRSA PCR Screening     Status: None   Collection Time: 08/28/17 10:22 AM  Result Value Ref Range Status   MRSA by PCR NEGATIVE NEGATIVE Final    Comment:        The GeneXpert MRSA Assay (FDA approved for NASAL specimens only), is one component of a comprehensive MRSA colonization surveillance program. It is not intended to diagnose MRSA infection nor to guide or monitor treatment for MRSA infections.     Radiology Reports No results found.   CBC  Recent Labs Lab 08/28/17 0057  WBC 6.4  HGB 13.3  HCT 38.9  PLT 200  MCV 90.5  MCH 30.9  MCHC 34.2  RDW 13.1    Chemistries   Recent Labs Lab 08/27/17 1444 08/28/17 0057  NA  --  139  K  --   3.7  CL  --  105  CO2  --  26  GLUCOSE  --  123*  BUN 17 15  CREATININE 0.84 0.49  CALCIUM  --  9.0   ------------------------------------------------------------------------------------------------------------------ estimated creatinine clearance is 79.1 mL/min (by C-G formula based on SCr of 0.49 mg/dL). ------------------------------------------------------------------------------------------------------------------  Recent Labs  08/27/17 1444  HGBA1C 7.1*   ------------------------------------------------------------------------------------------------------------------ No results for input(s): CHOL, HDL, LDLCALC, TRIG, CHOLHDL, LDLDIRECT in the last 72 hours. ------------------------------------------------------------------------------------------------------------------ No results for input(s): TSH, T4TOTAL, T3FREE, THYROIDAB in the last 72 hours.  Invalid input(s): FREET3 ------------------------------------------------------------------------------------------------------------------ No results for input(s): VITAMINB12, FOLATE, FERRITIN, TIBC, IRON, RETICCTPCT in the last 72 hours.  Coagulation profile  Recent Labs Lab 08/27/17 1909  INR 1.20    No results for input(s): DDIMER in the last 72 hours.  Cardiac Enzymes No results for input(s): CKMB, TROPONINI, MYOGLOBIN in the last 168 hours.  Invalid input(s): CK ------------------------------------------------------------------------------------------------------------------ Invalid input(s): POCBNP    Assessment & Plan   Pt is 56 year old with history of previous stroke *Sinus tachycardia likely due to agitation Due to agitation Okay to proceed to surgery  * left lower extremity DVT S/p Left lower extremity thrombectomy  Stop heparin and resume xarelto   * hypertension. Continue Toprol.   * insulin-dependent diabetes mellitus. Reduced dose of Lantus. Sliding scale insulin added.  * history of  CVA with aphasia and left-sided weakness. On aspirin and statin.  From a medical standpoint if stable  For discharge      Code Status Orders        Start     Ordered   08/27/17 1718  Full code  Continuous     08/27/17 1723  Code Status History    Date Active Date Inactive Code Status Order ID Comments User Context   This patient has a current code status but no historical code status.               DVT Prophylaxis  heparin  Lab Results  Component Value Date   PLT 200 08/28/2017     Time Spent in minutes    Greater than 50% of time spent in care coordination and counseling patient regarding the condition and plan of care.   Auburn Bilberry M.D on 08/29/2017 at 8:55 AM  Between 7am to 6pm - Pager - [phone]  After 6pm go to [email or web address] - password [email or web address] [facility]  [facility] Hospitalists   Office  [phone]

## 2017-08-29 NOTE — NC FL2 (Signed)
[place] MEDICAID FL2 LEVEL OF CARE SCREENING TOOL     IDENTIFICATION  Patient Name: [name] Birthdate: [birth year] Sex: female Admission Date (Current Location): 08/27/2017  [place] and [place] Number:  [address] [id number] [facility] Facility and Address:  [facility], [address], [place], [place] [address]      Provider Number: [id number]  Attending Physician Name and Address:  Renford Dills, MD  Relative Name and Phone Number:  [name]   [phone] or Abigail Miyamoto [phone]     Current Level of Care: Hospital Recommended Level of Care: Skilled Nursing Facility Prior Approval Number:    Date Approved/Denied:   PASRR Number: [id number]  Discharge Plan: SNF    Current Diagnoses: Patient Active Problem List   Diagnosis Date Noted  . Tachycardia 08/27/2017  . Acute deep vein thrombosis (DVT) of left lower extremity (HCC) 08/22/2017  . Type 2 diabetes mellitus with complication (HCC) 08/22/2017  . Essential hypertension 08/22/2017    Orientation RESPIRATION BLADDER Height & Weight     Self  Normal Incontinent Weight: 162 lb 9.6 oz (73.8 kg) Height:   (162.6 cm)  BEHAVIORAL SYMPTOMS/MOOD NEUROLOGICAL BOWEL NUTRITION STATUS      Incontinent Feeding tube  AMBULATORY STATUS COMMUNICATION OF NEEDS Skin   Total Care Verbally PU Stage and Appropriate Care, Surgical wounds   PU Stage 2 Dressing:  (PRN dressing change)                   Personal Care Assistance Level of Assistance    Bathing Assistance: Maximum assistance     Total Care Assistance: Maximum assistance   Functional Limitations Info  Sight, Hearing, Speech Sight Info: Adequate Hearing Info: Adequate Speech Info: Impaired    SPECIAL CARE FACTORS FREQUENCY                       Contractures Contractures Info: Not present    Additional Factors Info  Code Status, Allergies, Insulin Sliding Scale Code Status Info: Full  Code Allergies Info: NKA   Insulin Sliding Scale Info: insulin aspart (novoLOG) injection 0-9 Units 3x a day with meals       Current Medications (08/29/2017):  This is the current hospital active medication list Current Facility-Administered Medications  Medication Dose Route Frequency Provider Last Rate Last Dose  . aspirin chewable tablet 81 mg  81 mg Per Tube Daily Oralia Manis, MD   81 mg at 08/29/17 1101  . bisacodyl (DULCOLAX) suppository 10 mg  10 mg Rectal Daily PRN Sudini, Wardell Heath, MD      . chlorhexidine (PERIDEX) 0.12 % solution 15 mL  15 mL Mouth Rinse BID Schnier, Latina Craver, MD   15 mL at 08/29/17 1101  . cloNIDine (CATAPRES - Dosed in mg/24 hr) patch 0.3 mg  0.3 mg Transdermal Weekly Sudini, Srikar, MD      . feeding supplement (GLUCERNA 1.5 CAL) liquid 237 mL  237 mL Per Tube 5 X Daily Schnier, Latina Craver, MD   237 mL at 08/29/17 1421  . free water 150 mL  150 mL Per Tube Q6H Schnier, Latina Craver, MD   150 mL at 08/29/17 1230  . HYDROcodone-acetaminophen (NORCO/VICODIN) 5-325 MG per tablet 1-2 tablet  1-2 tablet Per Tube Q4H PRN Oralia Manis, MD   1 tablet at 08/27/17 2244  . insulin aspart (novoLOG) injection 0-9 Units  0-9 Units Subcutaneous TID WC Milagros Loll, MD   1 Units at 08/29/17 1155  . insulin  glargine (LANTUS) injection 4 Units  4 Units Subcutaneous QHS Milagros Loll, MD   4 Units at 08/28/17 2202  . magnesium hydroxide (MILK OF MAGNESIA) suspension 30 mL  30 mL Per Tube Daily PRN Oralia Manis, MD      . MEDLINE mouth rinse  15 mL Mouth Rinse q12n4p Schnier, Latina Craver, MD   15 mL at 08/29/17 1200  . metoprolol tartrate (LOPRESSOR) tablet 50 mg  50 mg Per Tube BID Oralia Manis, MD   50 mg at 08/29/17 1100  . ondansetron (ZOFRAN) injection 4 mg  4 mg Intravenous Q6H PRN Schnier, Latina Craver, MD      . polyethylene glycol (MIRALAX / GLYCOLAX) packet 17 g  17 g Per Tube Lamont Snowball, MD   17 g at 08/28/17 2202  . rivaroxaban (XARELTO) tablet 20 mg  20 mg Oral  Daily Hallaji, Sheema M, RPH   20 mg at 08/29/17 1101  . sodium phosphate (FLEET) 7-19 GM/118ML enema 1 enema  1 enema Rectal Once PRN Schnier, Latina Craver, MD         Discharge Medications: Please Adia discharge summary for a list of discharge medications.  Relevant Imaging Results:  Relevant Lab Results:   Additional Information SSN [id number]  Darleene Cleaver, [place]

## 2017-08-29 NOTE — Clinical Social Work Note (Signed)
CSW attempted to contact patient's son Roe Coombs [phone] to inform him that patient will be discharging back to [facility] today.  CSW left message awaiting for call back from patient's son.  Patient to be d/c'ed today to [facility].  Patient and family agreeable to plans will transport via ems RN to call report.  Windell Moulding, MSW, Theresia Majors [phone]

## 2017-08-29 NOTE — Discharge Summary (Signed)
[facility]    Discharge Summary    Patient ID:  [name] MRN: [id number] DOB/AGE: 1961/03/07 56 y.o.  Admit date: 08/27/2017 Discharge date: 08/29/2017 Date of Surgery: 08/27/2017 - 08/28/2017 Surgeon: Moishe Spice): Hymen Arnett, Latina Craver, MD  Admission Diagnosis: Tachycardia [R00.0]  Discharge Diagnoses:  Tachycardia [R00.0]  Secondary Diagnoses: Past Medical History:  Diagnosis Date  . Aphasia   . CVA (cerebral vascular accident) (HCC)   . Diabetes mellitus without complication (HCC)   . GERD (gastroesophageal reflux disease)   . Hemiplegia (HCC)   . Hypertension   . Lack of coordination   . Vertigo     Procedure(s): PERIPHERAL VASCULAR THROMBECTOMY IVC FILTER INSERTION  Discharged Condition: good  HPI:  The patient was admitted to the hospital on 25 September, 2018 secondary to tachycardia and agitation. She had come to [facility] on that day for thrombectomy of extensive DVT in the left lower extremity. At the beginning of the procedure when the patient was moved from the bed to the edge of table she was very agitated and moving quite a bit she was also noted to be tachycardic up to 170 bpm. It was felt at this point that the procedure could not be safely performed and I admitted her to telemetry and asked medicine to Ellamarie her. On hospital day #2 her heart rate was significantly improved and I arranged for general anesthesia. This provided the necessary stability and successful thrombectomy of her left common femoral and left external iliac vein was performed. Postoperatively she was maintained on heparin for 24 hours. On examination this morning her leg is tremendously improved is now soft and much smaller. She is back on her Xarelto and fit for discharge. She will return to her skilled nursing facility.  Hospital Course:  [name] is a 56 y.o. female is S/P Left Procedure(s): PERIPHERAL VASCULAR THROMBECTOMY IVC  FILTER INSERTION Extubated: POD # 0 Physical exam: left lower extremity is now much less edematous is soft foot is pink and warm Post-op wounds clean, dry, intact or healing well Pt. Ambulating, voiding and taking PO diet without difficulty. Pt pain controlled with PO pain meds. Labs as below Complications:none  Consults: internal medicine   Significant Diagnostic Studies: CBC Lab Results  Component Value Date   WBC 6.4 08/28/2017   HGB 13.3 08/28/2017   HCT 38.9 08/28/2017   MCV 90.5 08/28/2017   PLT 200 08/28/2017    BMET    Component Value Date/Time   NA 139 08/28/2017 0057   NA 136 06/28/2012 0523   K 3.7 08/28/2017 0057   K 4.3 06/28/2012 0523   CL 105 08/28/2017 0057   CL 100 06/28/2012 0523   CO2 26 08/28/2017 0057   CO2 26 06/28/2012 0523   GLUCOSE 123 (H) 08/28/2017 0057   GLUCOSE 136 (H) 06/28/2012 0523   BUN 15 08/28/2017 0057   BUN 20 (H) 06/28/2012 0523   CREATININE 0.49 08/28/2017 0057   CREATININE 1.01 06/28/2012 0523   CALCIUM 9.0 08/28/2017 0057   CALCIUM 8.8 06/28/2012 0523   GFRNONAA >60 08/28/2017 0057   GFRNONAA >60 06/28/2012 0523   GFRAA >60 08/28/2017 0057   GFRAA >60 06/28/2012 0523   COAG Lab Results  Component Value Date   INR 1.20 08/27/2017   INR 0.9 06/05/2012     Disposition:  Discharge to :Nursing Home Discharge Instructions    Call MD for:  redness, tenderness, or signs of infection (pain, swelling, bleeding, redness, odor  or green/yellow discharge around incision site)    Complete by:  As directed    Call MD for:  severe or increased pain, loss or decreased feeling  in affected limb(s)    Complete by:  As directed    Call MD for:  temperature >100.5    Complete by:  As directed    Discharge instructions    Complete by:  As directed    After 21 days of Xarelto 20 mg po dailys the dose changes to 15 mg po daily   Resume previous diet    Complete by:  As directed      Allergies as of 08/29/2017   No Known  Allergies     Medication List    TAKE these medications   amLODipine 10 MG tablet Commonly known as:  NORVASC Take 10 mg by mouth at bedtime.   aspirin 81 MG chewable tablet Chew by mouth daily.   BIOFREEZE 4 % Gel Generic drug:  Menthol (Topical Analgesic) Apply 1 application topically every 8 (eight) hours as needed. Apply to left arm, shoulder, and elbow   bisacodyl 10 MG suppository Commonly known as:  DULCOLAX Place 10 mg rectally daily as needed for moderate constipation.   bisacodyl 10 MG/30ML Enem Commonly known as:  FLEET Place 10 mg rectally daily as needed.   cloNIDine 0.3 mg/24hr patch Commonly known as:  CATAPRES - Dosed in mg/24 hr Place 0.3 mg onto the skin once a week.   doxycycline 100 MG capsule Commonly known as:  VIBRAMYCIN Take 100 mg by mouth 2 (two) times daily.   glimepiride 2 MG tablet Commonly known as:  AMARYL Take 2 mg by mouth daily with breakfast.   insulin aspart 100 UNIT/ML injection Commonly known as:  novoLOG Inject into the skin as needed for high blood sugar.   insulin glargine 100 UNIT/ML injection Commonly known as:  LANTUS Inject 8 Units into the skin at bedtime.   lisinopril 40 MG tablet Commonly known as:  PRINIVIL,ZESTRIL Take 40 mg by mouth daily.   magnesium oxide 400 MG tablet Commonly known as:  MAG-OX Take 400 mg by mouth daily.   metoprolol succinate 100 MG 24 hr tablet Commonly known as:  TOPROL-XL Take 100 mg by mouth daily. Take with or immediately following a meal.   multivitamin capsule Take 1 capsule by mouth daily.   omeprazole 2 mg/mL Susp Commonly known as:  PRILOSEC Place 10 mg into feeding tube daily.   polyethylene glycol packet Commonly known as:  MIRALAX / GLYCOLAX Take 17 g by mouth at bedtime.   rivaroxaban 20 MG Tabs tablet Commonly known as:  XARELTO Take 1 tablet (20 mg total) by mouth daily with supper. After 21 days change the Xarelto dose to 15 mg pfo daily What changed:   additional instructions   TYLENOL/CODEINE #3 300-30 MG tablet Generic drug:  Acetaminophen-Codeine Take 1 tablet by mouth every 6 (six) hours as needed for pain.            Discharge Care Instructions        Start     Ordered   08/29/17 0000  rivaroxaban (XARELTO) 20 MG TABS tablet  Daily with supper     08/29/17 1222   08/29/17 0000  Resume previous diet     08/29/17 1222   08/29/17 0000  Call MD for:  temperature >100.5     08/29/17 1222   08/29/17 0000  Call MD for:  redness, tenderness, or signs  of infection (pain, swelling, bleeding, redness, odor or green/yellow discharge around incision site)     08/29/17 1222   08/29/17 0000  Call MD for:  severe or increased pain, loss or decreased feeling  in affected limb(s)     08/29/17 1222   08/29/17 0000  Discharge instructions    Comments:  After 21 days of Xarelto 20 mg po dailys the dose changes to 15 mg po daily   08/29/17 1222     Verbal and written Discharge instructions given to the patient. Wound care per Discharge AVS   Signed: Levora Dredge, MD  08/29/2017, 12:23 PM

## 2017-08-29 NOTE — Progress Notes (Signed)
Patient returning to [facility] today. Report called to Eppie Gibson RN. EMS called for transport. Packet prepared by Minerva Areola, SW. IV and telemetry removed prior to leaving. Translator used to update patient.

## 2018-06-08 ENCOUNTER — Emergency Department: Payer: Medicaid Other

## 2018-06-08 ENCOUNTER — Inpatient Hospital Stay
Admission: EM | Admit: 2018-06-08 | Discharge: 2018-06-11 | DRG: 871 | Disposition: A | Discharge status: Skilled Nursing Facility | Payer: Medicaid Other | Attending: Internal Medicine | Admitting: Internal Medicine

## 2018-06-08 ENCOUNTER — Encounter: Payer: Self-pay | Admitting: Radiology

## 2018-06-08 DIAGNOSIS — Z794 Long term (current) use of insulin: Secondary | ICD-10-CM

## 2018-06-08 DIAGNOSIS — I69354 Hemiplegia and hemiparesis following cerebral infarction affecting left non-dominant side: Secondary | ICD-10-CM

## 2018-06-08 DIAGNOSIS — K9423 Gastrostomy malfunction: Secondary | ICD-10-CM | POA: Diagnosis present

## 2018-06-08 DIAGNOSIS — E1165 Type 2 diabetes mellitus with hyperglycemia: Secondary | ICD-10-CM | POA: Diagnosis present

## 2018-06-08 DIAGNOSIS — Z95828 Presence of other vascular implants and grafts: Secondary | ICD-10-CM

## 2018-06-08 DIAGNOSIS — E876 Hypokalemia: Secondary | ICD-10-CM | POA: Diagnosis not present

## 2018-06-08 DIAGNOSIS — I10 Essential (primary) hypertension: Secondary | ICD-10-CM | POA: Diagnosis present

## 2018-06-08 DIAGNOSIS — J9601 Acute respiratory failure with hypoxia: Secondary | ICD-10-CM | POA: Diagnosis present

## 2018-06-08 DIAGNOSIS — Z7901 Long term (current) use of anticoagulants: Secondary | ICD-10-CM | POA: Diagnosis not present

## 2018-06-08 DIAGNOSIS — E440 Moderate protein-calorie malnutrition: Secondary | ICD-10-CM

## 2018-06-08 DIAGNOSIS — I69320 Aphasia following cerebral infarction: Secondary | ICD-10-CM | POA: Diagnosis not present

## 2018-06-08 DIAGNOSIS — Z931 Gastrostomy status: Secondary | ICD-10-CM

## 2018-06-08 DIAGNOSIS — A419 Sepsis, unspecified organism: Secondary | ICD-10-CM | POA: Diagnosis present

## 2018-06-08 DIAGNOSIS — J690 Pneumonitis due to inhalation of food and vomit: Secondary | ICD-10-CM | POA: Diagnosis present

## 2018-06-08 DIAGNOSIS — K219 Gastro-esophageal reflux disease without esophagitis: Secondary | ICD-10-CM | POA: Diagnosis present

## 2018-06-08 DIAGNOSIS — K920 Hematemesis: Secondary | ICD-10-CM | POA: Diagnosis present

## 2018-06-08 DIAGNOSIS — E870 Hyperosmolality and hypernatremia: Secondary | ICD-10-CM | POA: Diagnosis not present

## 2018-06-08 DIAGNOSIS — I482 Chronic atrial fibrillation: Secondary | ICD-10-CM | POA: Diagnosis present

## 2018-06-08 LAB — COMPREHENSIVE METABOLIC PANEL
ALK PHOS: 64 U/L (ref 38–126)
ALT: 39 U/L (ref 0–44)
AST: 42 U/L — ABNORMAL HIGH (ref 15–41)
Albumin: 3.3 g/dL — ABNORMAL LOW (ref 3.5–5.0)
Anion gap: 11 (ref 5–15)
BILIRUBIN TOTAL: 1.1 mg/dL (ref 0.3–1.2)
BUN: 54 mg/dL — ABNORMAL HIGH (ref 6–20)
CALCIUM: 8.8 mg/dL — AB (ref 8.9–10.3)
CO2: 31 mmol/L (ref 22–32)
CREATININE: 0.8 mg/dL (ref 0.44–1.00)
Chloride: 99 mmol/L (ref 98–111)
GFR calc non Af Amer: >60 mL/min (ref >60)
GLUCOSE: 400 mg/dL — AB (ref 70–99)
Potassium: 4.4 mmol/L (ref 3.5–5.1)
Sodium: 141 mmol/L (ref 135–145)
TOTAL PROTEIN: 8 g/dL (ref 6.5–8.1)

## 2018-06-08 LAB — LACTIC ACID, PLASMA
Lactic Acid, Venous: 3.3 mmol/L (ref 0.5–1.9)
Lactic Acid, Venous: 2.6 mmol/L (ref 0.5–1.9)

## 2018-06-08 LAB — URINALYSIS, COMPLETE (UACMP) WITH MICROSCOPIC
BACTERIA UA: NONE SEEN
BILIRUBIN URINE: NEGATIVE
GLUCOSE, UA: 50 mg/dL — AB
HGB URINE DIPSTICK: NEGATIVE
Ketones, ur: NEGATIVE mg/dL
NITRITE: NEGATIVE
Protein, ur: NEGATIVE mg/dL
Specific Gravity, Urine: 1.023 (ref 1.005–1.030)
pH: 5 (ref 5.0–8.0)

## 2018-06-08 LAB — CBC WITH DIFFERENTIAL/PLATELET
Basophils Absolute: 0 10*3/uL (ref 0–0.1)
Basophils Relative: 0 %
Eosinophils Absolute: 0 10*3/uL (ref 0–0.7)
Eosinophils Relative: 0 %
HEMATOCRIT: 44.4 % (ref 35.0–47.0)
HEMOGLOBIN: 14.8 g/dL (ref 12.0–16.0)
LYMPHS ABS: 0.8 10*3/uL — AB (ref 1.0–3.6)
LYMPHS PCT: 5 %
MCH: 32.6 pg (ref 26.0–34.0)
MCHC: 33.2 g/dL (ref 32.0–36.0)
MCV: 98.1 fL (ref 80.0–100.0)
MONOS PCT: 3 %
Monocytes Absolute: 0.5 10*3/uL (ref 0.2–0.9)
NEUTROS ABS: 14.9 10*3/uL — AB (ref 1.4–6.5)
NEUTROS PCT: 92 %
Platelets: 226 10*3/uL (ref 150–440)
RBC: 4.53 MIL/uL (ref 3.80–5.20)
RDW: 15.2 % — ABNORMAL HIGH (ref 11.5–14.5)
WBC: 16.2 10*3/uL — ABNORMAL HIGH (ref 3.6–11.0)

## 2018-06-08 LAB — PROTIME-INR
INR: 1.07
PROTHROMBIN TIME: 13.8 s (ref 11.4–15.2)

## 2018-06-08 MED ORDER — VANCOMYCIN HCL IN DEXTROSE 1-5 GM/200ML-% IV SOLN
INTRAVENOUS | Status: AC
  Filled 2018-06-08: qty 200

## 2018-06-08 MED ORDER — SODIUM CHLORIDE 0.9 % IV BOLUS (SEPSIS)
1000.0000 mL | Freq: Once | INTRAVENOUS | Status: AC
  Administered 2018-06-08: 1000 mL via INTRAVENOUS

## 2018-06-08 MED ORDER — IOHEXOL 300 MG/ML  SOLN
100.0000 mL | Freq: Once | INTRAMUSCULAR | Status: AC | PRN
  Administered 2018-06-08: 100 mL via INTRAVENOUS

## 2018-06-08 MED ORDER — SODIUM CHLORIDE 0.9 % IV SOLN
INTRAVENOUS | Status: AC
  Filled 2018-06-08: qty 2

## 2018-06-08 MED ORDER — SODIUM CHLORIDE 0.9 % IV BOLUS
1000.0000 mL | Freq: Once | INTRAVENOUS | Status: AC
  Administered 2018-06-08: 1000 mL via INTRAVENOUS

## 2018-06-08 MED ORDER — SODIUM CHLORIDE 0.9 % IV BOLUS (SEPSIS)
250.0000 mL | Freq: Once | INTRAVENOUS | Status: AC
  Administered 2018-06-08: 250 mL via INTRAVENOUS

## 2018-06-08 MED ORDER — SODIUM CHLORIDE 0.9 % IV SOLN
2.0000 g | Freq: Once | INTRAVENOUS | Status: AC
  Administered 2018-06-08: 2 g via INTRAVENOUS

## 2018-06-08 MED ORDER — VANCOMYCIN HCL IN DEXTROSE 1-5 GM/200ML-% IV SOLN
1000.0000 mg | Freq: Once | INTRAVENOUS | Status: AC
  Administered 2018-06-08: 1000 mg via INTRAVENOUS

## 2018-06-08 MED ORDER — KETOROLAC TROMETHAMINE 30 MG/ML IJ SOLN
15.0000 mg | INTRAMUSCULAR | Status: AC
  Administered 2018-06-08: 15 mg via INTRAVENOUS
  Filled 2018-06-08: qty 1

## 2018-06-08 MED ORDER — SODIUM CHLORIDE 0.9 % IV BOLUS (SEPSIS)
500.0000 mL | Freq: Once | INTRAVENOUS | Status: AC
  Administered 2018-06-08: 500 mL via INTRAVENOUS

## 2018-06-08 MED ORDER — ACETAMINOPHEN 650 MG RE SUPP
RECTAL | Status: AC
  Filled 2018-06-08: qty 1

## 2018-06-08 NOTE — ED Notes (Signed)
Patient transported to CT 

## 2018-06-08 NOTE — ED Notes (Signed)
Pt returned from ct. Onalee Huaavid, rn in to attempt venipuncture for lactic acid.

## 2018-06-08 NOTE — ED Provider Notes (Signed)
[facility] Emergency Department Provider Note  ____________________________________________  Time seen: Approximately 7:40 PM  I have reviewed the triage vital signs and the nursing notes.   HISTORY  Chief Complaint Hematemesis  Level 5 Caveat: Portions of the History and Physical including HPI and review of systems are unable to be completely obtained due to patient being a poor historian due to chronic nonverbal state   HPI [name] is a 57 y.o. female with a history of diabetes hypertension and stroke resulting in chronic aphasia who was sent to the ED today due to fever and dark appearing emesis at her healthcare facility.   They report that she vomits on a daily basis, but today the emesis looked dark and they are concerned it might be bloody.  No other notable symptoms reported by the healthcare facility.     Past Medical History:  Diagnosis Date  . Aphasia   . CVA (cerebral vascular accident) (HCC)   . Diabetes mellitus without complication (HCC)   . GERD (gastroesophageal reflux disease)   . Hemiplegia (HCC)   . Hypertension   . Lack of coordination   . Vertigo      Patient Active Problem List   Diagnosis Date Noted  . Tachycardia 08/27/2017  . Acute deep vein thrombosis (DVT) of left lower extremity (HCC) 08/22/2017  . Type 2 diabetes mellitus with complication (HCC) 08/22/2017  . Essential hypertension 08/22/2017     Past Surgical History:  Procedure Laterality Date  . IVC FILTER INSERTION N/A 08/28/2017   Procedure: IVC FILTER INSERTION;  Surgeon: Renford Dills, MD;  Location: [facility] INVASIVE CV LAB;  Service: Cardiovascular;  Laterality: N/A;  . PERIPHERAL VASCULAR THROMBECTOMY Left 08/28/2017   Procedure: PERIPHERAL VASCULAR THROMBECTOMY;  Surgeon: Renford Dills, MD;  Location: [facility] INVASIVE CV LAB;  Service: Cardiovascular;  Laterality: Left;     Prior to Admission medications   Medication Sig Start Date End Date  Taking? Authorizing Provider  amLODipine (NORVASC) 10 MG tablet Place 10 mg into feeding tube at bedtime.    Yes [provider]  bisacodyl (DULCOLAX) 10 MG suppository Place 10 mg rectally daily as needed for moderate constipation.   Yes [provider]  cloNIDine (CATAPRES - DOSED IN MG/24 HR) 0.3 mg/24hr patch Place 0.3 mg onto the skin every Thursday at 6pm.    Yes [provider]  insulin aspart (NOVOLOG) 100 UNIT/ML injection Inject three times daily with meals and at bedtime per sliding scale:  0-200: 0u 201-250: 2u 251-300: 4u 301-350: 6u 351-400: 8u 401-450: 10u +450: Contact MD   Yes [provider]  insulin glargine (LANTUS) 100 UNIT/ML injection Inject 20 Units into the skin at bedtime.    Yes [provider]  lisinopril (PRINIVIL,ZESTRIL) 40 MG tablet Take 40 mg by mouth daily.   Yes [provider]  magnesium oxide (MAG-OX) 400 MG tablet Take 400 mg by mouth daily.   Yes [provider]  Menthol, Topical Analgesic, (BIOFREEZE) 4 % GEL Apply 1 application topically every 8 (eight) hours as needed. Apply to left arm, shoulder, and elbow   Yes [provider]  metoprolol tartrate (LOPRESSOR) 50 MG tablet Place 50 mg into feeding tube 2 (two) times daily.   Yes [provider]  Multiple Vitamin (MULTIVITAMIN) LIQD Place 15 mLs into feeding tube daily.   Yes [provider]  omeprazole (PRILOSEC) 2 mg/mL SUSP Place 10 mg into feeding tube daily.   Yes [provider]  ondansetron (ZOFRAN) 4 MG tablet Place 4 mg into feeding tube every 6 (six) hours as needed for nausea or vomiting.   Yes [provider]  polyethylene glycol (MIRALAX / GLYCOLAX) packet Place 17 g into feeding tube at bedtime.    Yes [provider]  Rivaroxaban (XARELTO) 15 MG TABS tablet Take 15 mg by mouth daily.   Yes [provider]     Allergies Patient has no known allergies.   No  family history on file.  Social History Social History   Tobacco Use  . Smoking status: Never Smoker  . Smokeless tobacco: Never Used  Substance Use Topics  . Alcohol use: No  . Drug use: Not on file    Review of Systems Level 5 Caveat: Portions of the History and Physical including HPI and review of systems are unable to be completely obtained due to patient being a poor historian from chronic aphasia Gastrointestinal: Positive for vomiting, negative for diarrhea   ____________________________________________   PHYSICAL EXAM:  VITAL SIGNS: ED Triage Vitals  Enc Vitals Group     BP 06/08/18 1909 109/63     Pulse Rate 06/08/18 1909 (!) 133     Resp 06/08/18 1909 (!) 24     Temp 06/08/18 1909 (!) 100.8 F (38.2 C)     Temp Source 06/08/18 1909 Rectal     SpO2 06/08/18 1909 93 %     Weight 06/08/18 1912 124 lb 12.5 oz (56.6 kg)     Height 06/08/18 1912 4\' 11"  (1.499 m)     Head Circumference --      Peak Flow --      Pain Score --      Pain Loc --      Pain Edu? --      Excl. in GC? --     Vital signs reviewed, nursing assessments reviewed.   Constitutional:   Awake and alert, not oriented.  Ill-appearing Eyes:   Conjunctivae are normal. EOMI. PERRL. ENT      Head:   Normocephalic and atraumatic.      Nose:   No congestion/rhinnorhea.       Mouth/Throat:   Dry mucous membranes, no pharyngeal erythema. No peritonsillar mass.       Neck:   No meningismus. Full ROM. Hematological/Lymphatic/Immunilogical:   No cervical lymphadenopathy. Cardiovascular:   Tachycardia heart rate 130. Symmetric bilateral radial and DP pulses.  No murmurs.  Respiratory: Tachypnea.  Diffuse left-sided crackles.  No wheezing.  Good air entry in all lung fields. Gastrointestinal:   Soft and nontender. Non distended. There is no CVA tenderness.  No rebound, rigidity, or guarding.  PEG tube in place on left upper quadrant abdominal wall with some pressure injury to the epidermis from the  bumper.  Brown stool, Hemoccult negative, controls okay Genitourinary:   Normal Musculoskeletal:   Normal range of motion in all extremities. No joint effusions.  No lower extremity tenderness.  No edema. Neurologic:   Nonverbal.  Follows commands Motor grossly intact. No acute focal neurologic deficits are appreciated.  Skin:    Skin is warm, dry and intact. No rash noted.  No petechiae, purpura, or bullae.  ____________________________________________    LABS (pertinent positives/negatives) (all labs ordered are listed, but only abnormal results are displayed) Labs Reviewed  COMPREHENSIVE METABOLIC PANEL - Abnormal; Notable for the following components:      Result Value   Glucose, Bld 400 (*)    BUN 54 (*)  Calcium 8.8 (*)    Albumin 3.3 (*)    AST 42 (*)    All other components within normal limits  LACTIC ACID, PLASMA - Abnormal; Notable for the following components:   Lactic Acid, Venous 3.3 (*)    All other components within normal limits  LACTIC ACID, PLASMA - Abnormal; Notable for the following components:   Lactic Acid, Venous 2.6 (*)    All other components within normal limits  CBC WITH DIFFERENTIAL/PLATELET - Abnormal; Notable for the following components:   WBC 16.2 (*)    RDW 15.2 (*)    Neutro Abs 14.9 (*)    Lymphs Abs 0.8 (*)    All other components within normal limits  URINALYSIS, COMPLETE (UACMP) WITH MICROSCOPIC - Abnormal; Notable for the following components:   Color, Urine YELLOW (*)    APPearance HAZY (*)    Glucose, UA 50 (*)    Leukocytes, UA SMALL (*)    All other components within normal limits  CULTURE, BLOOD (ROUTINE X 2)  CULTURE, BLOOD (ROUTINE X 2)  URINE CULTURE  PROTIME-INR   ____________________________________________   EKG  Interpreted by me Sinus tachycardia rate 132, normal axis and intervals.  Normal QRS ST segments and T waves.  ____________________________________________    RADIOLOGY  Ct Chest W  Contrast  Result Date: 06/08/2018 CLINICAL DATA:  57 y/o F; brown emesis, hypoxia, fever. Code sepsis. EXAM: CT CHEST, ABDOMEN, AND PELVIS WITH CONTRAST TECHNIQUE: Multidetector CT imaging of the chest, abdomen and pelvis was performed following the standard protocol during bolus administration of intravenous contrast. CONTRAST:  OMNIPAQUE IOHEXOL 300 MG/ML  SOLN COMPARISON:  06/28/2012 CT abdomen and pelvis.  06/05/2012 CT chest. FINDINGS: CT CHEST FINDINGS Cardiovascular: Normal heart size. No pericardial effusion. Aortic and severe coronary artery calcific atherosclerosis. Mediastinum/Nodes: No enlarged mediastinal, hilar, or axillary lymph nodes. Thyroid gland, trachea, and esophagus demonstrate no significant findings. Lungs/Pleura: Minor dependent ground-glass opacities in the lungs compatible with atelectasis. No pleural effusion or pneumothorax. No consolidation Musculoskeletal: No acute fracture. CT ABDOMEN PELVIS FINDINGS Hepatobiliary: No focal liver abnormality is seen. No gallstones, gallbladder wall thickening, or biliary dilatation. Pancreas: Unremarkable. No pancreatic ductal dilatation or surrounding inflammatory changes. Spleen: Normal in size without focal abnormality. Adrenals/Urinary Tract: Normal adrenal glands. No focal kidney lesion. Multiple punctate nonobstructing stones in the right kidney. No hydronephrosis. Mild bladder distention. Stomach/Bowel: Percutaneous gastrostomy catheter with the balloon expanded in the proximal jejunum. Upstream distention of the stomach and duodenum. Large volume of stool in the colon and rectum compatible with constipation. Normal appendix. Vascular/Lymphatic: Aortic atherosclerosis. No enlarged abdominal or pelvic lymph nodes. IVC filter in situ. Reproductive: Uterus and bilateral adnexa are unremarkable. Other: No abdominal wall hernia or abnormality. No abdominopelvic ascites. Musculoskeletal: No fracture is seen. IMPRESSION: 1. The gastrostomy tube  is advanced into the proximal jejunum where the balloon is expanded. Upstream the duodenum and stomach are distended with fluid indicating obstruction. Additionally, there is wall thickening of the distal duodenum probably due to compression from the gastrostomy catheter and/or associated inflammation. 2. Right kidney nonobstructive punctate nephrolithiasis. 3. Moderate aortic and severe coronary artery calcific atherosclerosis. 4. Large volume of stool in the distal colon and rectum, probably constipation or chronic dysmotility. These results were called by telephone at the time of interpretation on 06/08/2018 at 9:50 pm to Dr. Sharman Cheek , who verbally acknowledged these results. Electronically Signed   By: Mitzi Hansen M.D.   On: 06/08/2018 21:55   Ct Abdomen  Pelvis W Contrast  Result Date: 06/08/2018 CLINICAL DATA:  57 y/o F; brown emesis, hypoxia, fever. Code sepsis. EXAM: CT CHEST, ABDOMEN, AND PELVIS WITH CONTRAST TECHNIQUE: Multidetector CT imaging of the chest, abdomen and pelvis was performed following the standard protocol during bolus administration of intravenous contrast. CONTRAST:  OMNIPAQUE IOHEXOL 300 MG/ML  SOLN COMPARISON:  06/28/2012 CT abdomen and pelvis.  06/05/2012 CT chest. FINDINGS: CT CHEST FINDINGS Cardiovascular: Normal heart size. No pericardial effusion. Aortic and severe coronary artery calcific atherosclerosis. Mediastinum/Nodes: No enlarged mediastinal, hilar, or axillary lymph nodes. Thyroid gland, trachea, and esophagus demonstrate no significant findings. Lungs/Pleura: Minor dependent ground-glass opacities in the lungs compatible with atelectasis. No pleural effusion or pneumothorax. No consolidation Musculoskeletal: No acute fracture. CT ABDOMEN PELVIS FINDINGS Hepatobiliary: No focal liver abnormality is seen. No gallstones, gallbladder wall thickening, or biliary dilatation. Pancreas: Unremarkable. No pancreatic ductal dilatation or surrounding  inflammatory changes. Spleen: Normal in size without focal abnormality. Adrenals/Urinary Tract: Normal adrenal glands. No focal kidney lesion. Multiple punctate nonobstructing stones in the right kidney. No hydronephrosis. Mild bladder distention. Stomach/Bowel: Percutaneous gastrostomy catheter with the balloon expanded in the proximal jejunum. Upstream distention of the stomach and duodenum. Large volume of stool in the colon and rectum compatible with constipation. Normal appendix. Vascular/Lymphatic: Aortic atherosclerosis. No enlarged abdominal or pelvic lymph nodes. IVC filter in situ. Reproductive: Uterus and bilateral adnexa are unremarkable. Other: No abdominal wall hernia or abnormality. No abdominopelvic ascites. Musculoskeletal: No fracture is seen. IMPRESSION: 1. The gastrostomy tube is advanced into the proximal jejunum where the balloon is expanded. Upstream the duodenum and stomach are distended with fluid indicating obstruction. Additionally, there is wall thickening of the distal duodenum probably due to compression from the gastrostomy catheter and/or associated inflammation. 2. Right kidney nonobstructive punctate nephrolithiasis. 3. Moderate aortic and severe coronary artery calcific atherosclerosis. 4. Large volume of stool in the distal colon and rectum, probably constipation or chronic dysmotility. These results were called by telephone at the time of interpretation on 06/08/2018 at 9:50 pm to Dr. Sharman Cheek , who verbally acknowledged these results. Electronically Signed   By: Mitzi Hansen M.D.   On: 06/08/2018 21:55   Dg Chest Port 1 View  Result Date: 06/08/2018 CLINICAL DATA:  Manson Passey emesis EXAM: PORTABLE CHEST 1 VIEW COMPARISON:  06/19/2012 FINDINGS: Artifact from EKG leads. An IVC filter is present. Low volume chest with interstitial crowding. There is no edema, consolidation, effusion, or pneumothorax. Prominent asymmetric left shoulder girdle osteopenia. Patient has  history of hemiplegia. IMPRESSION: Limited low volume chest without definite or focal abnormality. Electronically Signed   By: Marnee Spring M.D.   On: 06/08/2018 20:39    ____________________________________________   PROCEDURES .Critical Care Performed by: Sharman Cheek, MD Authorized by: Sharman Cheek, MD   Critical care provider statement:    Critical care time (minutes):  35   Critical care time was exclusive of:  Separately billable procedures and treating other patients   Critical care was necessary to treat or prevent imminent or life-threatening deterioration of the following conditions:  Sepsis and respiratory failure   Critical care was time spent personally by me on the following activities:  Development of treatment plan with patient or surrogate, discussions with consultants, evaluation of patient's response to treatment, examination of patient, obtaining history from patient or surrogate, ordering and performing treatments and interventions, ordering and review of laboratory studies, ordering and review of radiographic studies, pulse oximetry, re-evaluation of patient's condition and review of old charts  ____________________________________________  DIFFERENTIAL DIAGNOSIS   Pneumonia, possibly aspiration, tract infection, sepsis  CLINICAL IMPRESSION / ASSESSMENT AND PLAN / ED COURSE  Pertinent labs & imaging results that were available during my care of the patient were reviewed by me and considered in my medical decision making ([facility] chart for details).      Clinical Course as of Jun 08 2244  Wynelle LinkSun Jun 08, 2018  1946 Presents with sepsis, code sepsis initiated, IV fluid bolus, empiric antibiotics with cefepime and cefepime and vancomycin to cover for healthcare associated pneumonia versus aspiration pneumonia is most likely cause with the hypoxia and frequent vomiting.  Hemoccult negative.   [PS]  2007 Lactate indicative of severe sepsis. UA unremarkable.  Other labs urnemarakble except hyperglycemia. Will f/u cxr, plant to admit for further sepsis care.   Lactic Acid, Venous(!!): 3.3 [PS]  2018 Cxr viewed by me. No obvious infiltrate, no ptx or edema. Will f/u rads read.    [PS]  2046 Radiology report on chest x-ray negative for infiltrate.  Given the inability to obtain any history due to the patient's nonverbal state, obvious septic process based on vital signs and lactate, I will obtain CT scan of chest abdomen pelvis to evaluate for occult pneumonia versus intra-abdominal pathology with her frequent vomiting.  I doubt meningitis or encephalitis at this time   [PS]  2049 Sepsis reassessment has been completed.    [PS]  2155 Discussed with radiology, no acute findings except that the balloon to her PEG tube is obstructing the jejunum.  There are inflammatory changes and this may be the source of her sepsis.  Tissue ischemia in this area may be causing elevated lactic acid as well.   [PS]  2157 Viewing  the CT images, I estimated that the PEG tube balloon needed to be retracted by about 6 cm.  I deflated the balloon, gently withdrew the tubing by 6 cm which was achieved without resistance.  Gently reinflated the balloon, again there was no resistance.  After doing so I was able to retract the tubing even more with the balloon inflated until it produced  a stop on its own confirming that it was in the stomach.   [PS]    Clinical Course User Index [PS] Sharman CheekStafford, Vivian Neuwirth, MD     ____________________________________________   FINAL CLINICAL IMPRESSION(S) / ED DIAGNOSES    Final diagnoses:  Aspiration pneumonia, unspecified aspiration pneumonia type, unspecified laterality, unspecified part of lung (HCC)  Sepsis, due to unspecified organism (HCC)  PEG tube malfunction [facility](HCC)     ED Discharge Orders    None      Portions of this note were generated with dragon dictation software. Dictation errors may occur despite best attempts at  proofreading.    Sharman CheekStafford, Izaan Kingbird, MD 06/08/18 2245

## 2018-06-08 NOTE — ED Notes (Signed)
Pt cleansed of large amount of brown stool.

## 2018-06-08 NOTE — ED Notes (Signed)
Critical lactic acid of 3.3 called from lab. Dr. Scotty CourtStafford notified, no new orders received.

## 2018-06-08 NOTE — ED Notes (Signed)
Pt cleansed of large amount of brown stool.  

## 2018-06-08 NOTE — Progress Notes (Signed)
CODE SEPSIS - PHARMACY COMMUNICATION  **Broad Spectrum Antibiotics should be administered within 1 hour of Sepsis diagnosis**  Time Code Sepsis Called/Page Received: 19:40  Antibiotics Ordered: Cefepime and Vancomycin  Time of 1st antibiotic administration: 19:43  Additional action taken by pharmacy: n/a  If necessary, Name of Provider/Nurse Contacted: n/a    Foye DeerLisa G Mikaila Grunert ,PharmD Clinical Pharmacist  06/08/2018  7:45 PM

## 2018-06-08 NOTE — ED Notes (Signed)
Pt with blood pressure 87/65. Dr. Scotty CourtStafford notified, order for additional ns bolus received.

## 2018-06-08 NOTE — ED Notes (Signed)
ED Provider at bedside. 

## 2018-06-08 NOTE — ED Notes (Signed)
PEG adjusted by dr. Scotty CourtStafford. Stoma is reddened and has brown drainage. Stoma cleansed, gauze dressing applied beneath PEG flange.

## 2018-06-08 NOTE — ED Triage Notes (Signed)
Pt non verbal from [facility] with emesis. Per staff emesis was brown. Pt with ra pox of 88%, placed on oxygen at 2lpm with rebound to 94%. Skin hot to touch.

## 2018-06-08 NOTE — H&P (Signed)
[facility] - [place] [facility]   PATIENT NAME: [name]    MR#:  [id number]  DATE OF BIRTH:  August 13, 1961  DATE OF ADMISSION:  06/08/2018  PRIMARY CARE PHYSICIAN: Derwood Kaplan, MD   REQUESTING/REFERRING PHYSICIAN:   CHIEF COMPLAINT:   Chief Complaint  Patient presents with  . Hematemesis    HISTORY OF PRESENT ILLNESS: [name]  is a 57 y.o. 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  She is unable to provide history due to her aphasia and lethargy.  Information was taken from reviewing the medical chart and from discussion with emergency room physician. Patient was transferred to emergency room for intractable nausea and vomiting, lethargy and shortness of breath going on for the past 2 days, gradually getting worse.  Most recent emesis episode was noted to be dark brown in color.  No reports of fever or chills, no bleeding. At the arrival to emergency room, patient was noted to be febrile tachycardic and hypoxic with oxygen saturation at 80%. Blood test done emergency room are remarkable for elevated WBC at 16.2, elevated blood sugar level of 400 and elevated lactic acid level at 3.3. CT scan of the chest and abdomen shows gastrostomy tube advanced into the proximal jejunum where the balloon is expanded. Upstream the duodenum and stomach are distended with fluid indicating obstruction. Additionally, there is wall thickening of the distal duodenum probably due to compression from the gastrostomy catheter and/or associated inflammation.  No PE or obvious infiltrate in the lungs. Patient is admitted for further evaluation and treatment.    PAST MEDICAL HISTORY:   Past Medical History:  Diagnosis Date  . Aphasia   . CVA (cerebral vascular accident) (HCC)   . Diabetes mellitus without complication (HCC)   . GERD (gastroesophageal reflux disease)    . Hemiplegia (HCC)   . Hypertension   . Lack of coordination   . Vertigo     PAST SURGICAL HISTORY:  Past Surgical History:  Procedure Laterality Date  . IVC FILTER INSERTION N/A 08/28/2017   Procedure: IVC FILTER INSERTION;  Surgeon: Renford Dills, MD;  Location: [facility] INVASIVE CV LAB;  Service: Cardiovascular;  Laterality: N/A;  . PERIPHERAL VASCULAR THROMBECTOMY Left 08/28/2017   Procedure: PERIPHERAL VASCULAR THROMBECTOMY;  Surgeon: Renford Dills, MD;  Location: [facility] INVASIVE CV LAB;  Service: Cardiovascular;  Laterality: Left;    SOCIAL HISTORY:  Social History   Tobacco Use  . Smoking status: Never Smoker  . Smokeless tobacco: Never Used  Substance Use Topics  . Alcohol use: No    FAMILY HISTORY: Patient is unable to give information about her family history.  DRUG ALLERGIES: No Known Allergies  REVIEW OF SYSTEMS:   Unable to obtain due to patient's aphasia and lethargy.  MEDICATIONS AT HOME:  Prior to Admission medications   Medication Sig Start Date End Date Taking? Authorizing Provider  amLODipine (NORVASC) 10 MG tablet Place 10 mg into feeding tube at bedtime.    Yes [provider]  bisacodyl (DULCOLAX) 10 MG suppository Place 10 mg rectally daily as needed for moderate constipation.   Yes [provider]  cloNIDine (CATAPRES - DOSED IN MG/24 HR) 0.3 mg/24hr patch Place 0.3 mg onto the skin every Thursday at 6pm.    Yes [provider]  insulin aspart (NOVOLOG) 100 UNIT/ML injection Inject three times daily with meals and at bedtime  per sliding scale:  0-200: 0u 201-250: 2u 251-300: 4u 301-350: 6u 351-400: 8u 401-450: 10u +450: Contact MD   Yes [provider]  insulin glargine (LANTUS) 100 UNIT/ML injection Inject 20 Units into the skin at bedtime.    Yes [provider]  lisinopril (PRINIVIL,ZESTRIL) 40 MG tablet Take 40 mg by mouth daily.   Yes [provider]  magnesium oxide (MAG-OX) 400  MG tablet Take 400 mg by mouth daily.   Yes [provider]  Menthol, Topical Analgesic, (BIOFREEZE) 4 % GEL Apply 1 application topically every 8 (eight) hours as needed. Apply to left arm, shoulder, and elbow   Yes [provider]  metoprolol tartrate (LOPRESSOR) 50 MG tablet Place 50 mg into feeding tube 2 (two) times daily.   Yes [provider]  Multiple Vitamin (MULTIVITAMIN) LIQD Place 15 mLs into feeding tube daily.   Yes [provider]  omeprazole (PRILOSEC) 2 mg/mL SUSP Place 10 mg into feeding tube daily.   Yes [provider]  ondansetron (ZOFRAN) 4 MG tablet Place 4 mg into feeding tube every 6 (six) hours as needed for nausea or vomiting.   Yes [provider]  polyethylene glycol (MIRALAX / GLYCOLAX) packet Place 17 g into feeding tube at bedtime.    Yes [provider]  Rivaroxaban (XARELTO) 15 MG TABS tablet Take 15 mg by mouth daily.   Yes [provider]      PHYSICAL EXAMINATION:   VITAL SIGNS: Blood pressure 105/69, pulse 100, temperature 99.6 F (37.6 C), resp. rate 18, height 4\' 11"  (1.499 m), weight 56.6 kg (124 lb 12.5 oz), SpO2 100 %.  GENERAL:  57 y.o.-year-old patient lying in the bed, she looks chronically ill, but nontoxic.  EYES: Pupils equal, round, reactive to light and accommodation. No scleral icterus.  HEENT: Head atraumatic, normocephalic. Oropharynx and nasopharynx clear.  NECK:  Supple, no jugular venous distention. No thyroid enlargement, no tenderness.  LUNGS: Reduced breath sounds bilaterally, no wheezing. No use of accessory muscles of respiration.  CARDIOVASCULAR: S1, S2 normal. No S3/S4.  ABDOMEN: G-tube in place.  Soft, tender in the epigastric area, nondistended. Bowel sounds present.  EXTREMITIES: No pedal edema, cyanosis, or clubbing.  NEUROLOGIC: Chronic aphasia and left hemiplegia noted status post remote stroke. PSYCHIATRIC: The patient is nonverbal, lethargic.  SKIN:  No obvious rash, lesion, or ulcer.   LABORATORY PANEL:   CBC Recent Labs  Lab 06/08/18 1926  WBC 16.2*  HGB 14.8  HCT 44.4  PLT 226  MCV 98.1  MCH 32.6  MCHC 33.2  RDW 15.2*  LYMPHSABS 0.8*  MONOABS 0.5  EOSABS 0.0  BASOSABS 0.0   ------------------------------------------------------------------------------------------------------------------  Chemistries  Recent Labs  Lab 06/08/18 1926  NA 141  K 4.4  CL 99  CO2 31  GLUCOSE 400*  BUN 54*  CREATININE 0.80  CALCIUM 8.8*  AST 42*  ALT 39  ALKPHOS 64  BILITOT 1.1   ------------------------------------------------------------------------------------------------------------------ estimated creatinine clearance is 61 mL/min (by C-G formula based on SCr of 0.8 mg/dL). ------------------------------------------------------------------------------------------------------------------ No results for input(s): TSH, T4TOTAL, T3FREE, THYROIDAB in the last 72 hours.  Invalid input(s): FREET3   Coagulation profile Recent Labs  Lab 06/08/18 1926  INR 1.07   ------------------------------------------------------------------------------------------------------------------- No results for input(s): DDIMER in the last 72 hours. -------------------------------------------------------------------------------------------------------------------  Cardiac Enzymes No results for input(s): CKMB, TROPONINI, MYOGLOBIN in the last 168 hours.  Invalid input(s): CK ------------------------------------------------------------------------------------------------------------------ Invalid input(s): POCBNP  ---------------------------------------------------------------------------------------------------------------  Urinalysis    Component Value  Date/Time   COLORURINE YELLOW (A) 06/08/2018 1928   APPEARANCEUR HAZY (A) 06/08/2018 1928   APPEARANCEUR Turbid 06/19/2012 1443   LABSPEC 1.023 06/08/2018 1928   LABSPEC 1.015  06/19/2012 1443   PHURINE 5.0 06/08/2018 1928   GLUCOSEU 50 (A) 06/08/2018 1928   GLUCOSEU 50 mg/dL 16/09/9603 5409   HGBUR NEGATIVE 06/08/2018 1928   BILIRUBINUR NEGATIVE 06/08/2018 1928   BILIRUBINUR Negative 06/19/2012 1443   KETONESUR NEGATIVE 06/08/2018 1928   PROTEINUR NEGATIVE 06/08/2018 1928   NITRITE NEGATIVE 06/08/2018 1928   LEUKOCYTESUR SMALL (A) 06/08/2018 1928   LEUKOCYTESUR 3+ 06/19/2012 1443     RADIOLOGY: Ct Chest W Contrast  Result Date: 06/08/2018 CLINICAL DATA:  57 y/o F; brown emesis, hypoxia, fever. Code sepsis. EXAM: CT CHEST, ABDOMEN, AND PELVIS WITH CONTRAST TECHNIQUE: Multidetector CT imaging of the chest, abdomen and pelvis was performed following the standard protocol during bolus administration of intravenous contrast. CONTRAST:  OMNIPAQUE IOHEXOL 300 MG/ML  SOLN COMPARISON:  06/28/2012 CT abdomen and pelvis.  06/05/2012 CT chest. FINDINGS: CT CHEST FINDINGS Cardiovascular: Normal heart size. No pericardial effusion. Aortic and severe coronary artery calcific atherosclerosis. Mediastinum/Nodes: No enlarged mediastinal, hilar, or axillary lymph nodes. Thyroid gland, trachea, and esophagus demonstrate no significant findings. Lungs/Pleura: Minor dependent ground-glass opacities in the lungs compatible with atelectasis. No pleural effusion or pneumothorax. No consolidation Musculoskeletal: No acute fracture. CT ABDOMEN PELVIS FINDINGS Hepatobiliary: No focal liver abnormality is seen. No gallstones, gallbladder wall thickening, or biliary dilatation. Pancreas: Unremarkable. No pancreatic ductal dilatation or surrounding inflammatory changes. Spleen: Normal in size without focal abnormality. Adrenals/Urinary Tract: Normal adrenal glands. No focal kidney lesion. Multiple punctate nonobstructing stones in the right kidney. No hydronephrosis. Mild bladder distention. Stomach/Bowel: Percutaneous gastrostomy catheter with the balloon expanded in the proximal jejunum.  Upstream distention of the stomach and duodenum. Large volume of stool in the colon and rectum compatible with constipation. Normal appendix. Vascular/Lymphatic: Aortic atherosclerosis. No enlarged abdominal or pelvic lymph nodes. IVC filter in situ. Reproductive: Uterus and bilateral adnexa are unremarkable. Other: No abdominal wall hernia or abnormality. No abdominopelvic ascites. Musculoskeletal: No fracture is seen. IMPRESSION: 1. The gastrostomy tube is advanced into the proximal jejunum where the balloon is expanded. Upstream the duodenum and stomach are distended with fluid indicating obstruction. Additionally, there is wall thickening of the distal duodenum probably due to compression from the gastrostomy catheter and/or associated inflammation. 2. Right kidney nonobstructive punctate nephrolithiasis. 3. Moderate aortic and severe coronary artery calcific atherosclerosis. 4. Large volume of stool in the distal colon and rectum, probably constipation or chronic dysmotility. These results were called by telephone at the time of interpretation on 06/08/2018 at 9:50 pm to Dr. Sharman Cheek , who verbally acknowledged these results. Electronically Signed   By: Mitzi Hansen M.D.   On: 06/08/2018 21:55   Ct Abdomen Pelvis W Contrast  Result Date: 06/08/2018 CLINICAL DATA:  57 y/o F; brown emesis, hypoxia, fever. Code sepsis. EXAM: CT CHEST, ABDOMEN, AND PELVIS WITH CONTRAST TECHNIQUE: Multidetector CT imaging of the chest, abdomen and pelvis was performed following the standard protocol during bolus administration of intravenous contrast. CONTRAST:  OMNIPAQUE IOHEXOL 300 MG/ML  SOLN COMPARISON:  06/28/2012 CT abdomen and pelvis.  06/05/2012 CT chest. FINDINGS: CT CHEST FINDINGS Cardiovascular: Normal heart size. No pericardial effusion. Aortic and severe coronary artery calcific atherosclerosis. Mediastinum/Nodes: No enlarged mediastinal, hilar, or axillary lymph nodes. Thyroid gland, trachea,  and esophagus demonstrate no significant findings. Lungs/Pleura: Minor dependent ground-glass opacities in the lungs  compatible with atelectasis. No pleural effusion or pneumothorax. No consolidation Musculoskeletal: No acute fracture. CT ABDOMEN PELVIS FINDINGS Hepatobiliary: No focal liver abnormality is seen. No gallstones, gallbladder wall thickening, or biliary dilatation. Pancreas: Unremarkable. No pancreatic ductal dilatation or surrounding inflammatory changes. Spleen: Normal in size without focal abnormality. Adrenals/Urinary Tract: Normal adrenal glands. No focal kidney lesion. Multiple punctate nonobstructing stones in the right kidney. No hydronephrosis. Mild bladder distention. Stomach/Bowel: Percutaneous gastrostomy catheter with the balloon expanded in the proximal jejunum. Upstream distention of the stomach and duodenum. Large volume of stool in the colon and rectum compatible with constipation. Normal appendix. Vascular/Lymphatic: Aortic atherosclerosis. No enlarged abdominal or pelvic lymph nodes. IVC filter in situ. Reproductive: Uterus and bilateral adnexa are unremarkable. Other: No abdominal wall hernia or abnormality. No abdominopelvic ascites. Musculoskeletal: No fracture is seen. IMPRESSION: 1. The gastrostomy tube is advanced into the proximal jejunum where the balloon is expanded. Upstream the duodenum and stomach are distended with fluid indicating obstruction. Additionally, there is wall thickening of the distal duodenum probably due to compression from the gastrostomy catheter and/or associated inflammation. 2. Right kidney nonobstructive punctate nephrolithiasis. 3. Moderate aortic and severe coronary artery calcific atherosclerosis. 4. Large volume of stool in the distal colon and rectum, probably constipation or chronic dysmotility. These results were called by telephone at the time of interpretation on 06/08/2018 at 9:50 pm to Dr. Sharman CheekPHILLIP STAFFORD , who verbally acknowledged these  results. Electronically Signed   By: Mitzi HansenLance  Furusawa-Stratton M.D.   On: 06/08/2018 21:55   Dg Chest Port 1 View  Result Date: 06/08/2018 CLINICAL DATA:  Manson PasseyBrown emesis EXAM: PORTABLE CHEST 1 VIEW COMPARISON:  06/19/2012 FINDINGS: Artifact from EKG leads. An IVC filter is present. Low volume chest with interstitial crowding. There is no edema, consolidation, effusion, or pneumothorax. Prominent asymmetric left shoulder girdle osteopenia. Patient has history of hemiplegia. IMPRESSION: Limited low volume chest without definite or focal abnormality. Electronically Signed   By: Marnee SpringJonathon  Watts M.D.   On: 06/08/2018 20:39    EKG: Orders placed or performed during the hospital encounter of 06/08/18  . ED EKG  . ED EKG  . EKG 12-Lead  . EKG 12-Lead    IMPRESSION AND PLAN:  1.  Sepsis, of unclear etiology.  Could be related to aspiration pneumonia.  We will start IV fluids and broad-spectrum IV antibiotics to cover for aspiration pneumonia.  Continue oxygen therapy. 2.  Acute respiratory failure with hypoxia, likely secondary to aspiration pneumonia.  [place] treatment as above under #1. 3.  Aspiration pneumonia, [place] treatment as above under #1. 4.  Intractable nausea and vomiting, likely related to G-tube advanced into proximal jejunum.  This was repositioned in the emergency room.  We will continue to monitor clinically closely.  Continue treatment with PPI.  Continue supportive measures with IV fluids and antiemetics.  Gastroenterology is consulted for further evaluation and treatment. 5.  Uncontrolled diabetes type 2.  Will monitor blood sugars before meals and at bedtime.  Will use insulin treatment during hospital stay. 6.  Chronic aphasia and left hemiplegia, status post remote stroke.  Continue supportive measures. 7.  Hypertension, stable, continue home medications.  All the records are reviewed and case discussed with ED provider.   CODE STATUS: Full Code Status History    Date Active Date  Inactive Code Status Order ID Comments User Context   08/27/2017 1723 08/29/2017 2030 Full Code [id number]  Schnier, Latina CraverGregory G, MD Inpatient       TOTAL TIME TAKING CARE  OF THIS PATIENT: 50 minutes.    Cammy Copa M.D on 06/08/2018 at 11:54 PM  Between 7am to 6pm - Pager - [phone]  After 6pm go to [email or web address] - password [email or web address]  [facility]  Office  [phone]  CC: Primary care physician; Derwood Kaplan, MD

## 2018-06-08 NOTE — ED Notes (Signed)
Critical lactic of 2.6 called from lab. Dr. Scotty CourtStafford notified.

## 2018-06-09 ENCOUNTER — Inpatient Hospital Stay: Payer: Medicaid Other

## 2018-06-09 DIAGNOSIS — K9423 Gastrostomy malfunction: Secondary | ICD-10-CM

## 2018-06-09 DIAGNOSIS — E440 Moderate protein-calorie malnutrition: Secondary | ICD-10-CM

## 2018-06-09 LAB — CBC
HCT: 35.2 % (ref 35.0–47.0)
HEMOGLOBIN: 12 g/dL (ref 12.0–16.0)
MCH: 33.5 pg (ref 26.0–34.0)
MCHC: 34.2 g/dL (ref 32.0–36.0)
MCV: 97.9 fL (ref 80.0–100.0)
PLATELETS: 134 10*3/uL — AB (ref 150–440)
RBC: 3.59 MIL/uL — ABNORMAL LOW (ref 3.80–5.20)
RDW: 14.8 % — ABNORMAL HIGH (ref 11.5–14.5)
WBC: 10.2 10*3/uL (ref 3.6–11.0)

## 2018-06-09 LAB — GLUCOSE, CAPILLARY
GLUCOSE-CAPILLARY: 169 mg/dL — AB (ref 70–99)
GLUCOSE-CAPILLARY: 150 mg/dL — AB (ref 70–99)
Glucose-Capillary: 164 mg/dL — ABNORMAL HIGH (ref 70–99)
Glucose-Capillary: 98 mg/dL (ref 70–99)
Glucose-Capillary: 152 mg/dL — ABNORMAL HIGH (ref 70–99)

## 2018-06-09 LAB — BASIC METABOLIC PANEL
ANION GAP: 5 (ref 5–15)
BUN: 32 mg/dL — ABNORMAL HIGH (ref 6–20)
CALCIUM: 7.6 mg/dL — AB (ref 8.9–10.3)
CO2: 27 mmol/L (ref 22–32)
Chloride: 112 mmol/L — ABNORMAL HIGH (ref 98–111)
Creatinine, Ser: 0.4 mg/dL — ABNORMAL LOW (ref 0.44–1.00)
GFR calc Af Amer: >60 mL/min (ref >60)
GLUCOSE: 186 mg/dL — AB (ref 70–99)
POTASSIUM: 3.5 mmol/L (ref 3.5–5.1)
Sodium: 144 mmol/L (ref 135–145)

## 2018-06-09 LAB — MRSA PCR SCREENING: MRSA BY PCR: NEGATIVE

## 2018-06-09 MED ORDER — BISACODYL 10 MG RE SUPP: 10.0000 mg | Freq: Every day | RECTAL | Status: DC | PRN

## 2018-06-09 MED ORDER — BISACODYL 5 MG PO TBEC: 5.0000 mg | DELAYED_RELEASE_TABLET | Freq: Every day | ORAL | Status: DC | PRN

## 2018-06-09 MED ORDER — SODIUM CHLORIDE 0.9 % IV SOLN
3.0000 g | Freq: Four times a day (QID) | INTRAVENOUS | Status: DC
  Administered 2018-06-09 – 2018-06-11 (×8): 3 g via INTRAVENOUS
  Filled 2018-06-09 (×10): qty 3

## 2018-06-09 MED ORDER — AMLODIPINE BESYLATE 5 MG PO TABS
10.0000 mg | ORAL_TABLET | Freq: Every day | ORAL | Status: DC
  Administered 2018-06-09 – 2018-06-10 (×2): 10 mg
  Filled 2018-06-09 (×2): qty 2

## 2018-06-09 MED ORDER — ONDANSETRON HCL 4 MG PO TABS: 4.0000 mg | ORAL_TABLET | Freq: Four times a day (QID) | ORAL | Status: DC | PRN

## 2018-06-09 MED ORDER — POLYETHYLENE GLYCOL 3350 17 G PO PACK
17.0000 g | PACK | Freq: Every day | ORAL | Status: DC
  Administered 2018-06-09 – 2018-06-10 (×2): 17 g
  Filled 2018-06-09 (×2): qty 1

## 2018-06-09 MED ORDER — OMEPRAZOLE 2 MG/ML ORAL SUSPENSION: 10.0000 mg | Freq: Every day | ORAL | Status: DC

## 2018-06-09 MED ORDER — INSULIN GLARGINE 100 UNIT/ML ~~LOC~~ SOLN
20.0000 [IU] | Freq: Every day | SUBCUTANEOUS | Status: DC
  Administered 2018-06-09 – 2018-06-10 (×2): 20 [IU] via SUBCUTANEOUS
  Filled 2018-06-09 (×4): qty 0.2

## 2018-06-09 MED ORDER — PANTOPRAZOLE SODIUM 40 MG PO PACK
20.0000 mg | PACK | Freq: Every day | ORAL | Status: DC
  Administered 2018-06-10 – 2018-06-11 (×2): 20 mg
  Filled 2018-06-09 (×3): qty 20

## 2018-06-09 MED ORDER — LISINOPRIL 10 MG PO TABS
40.0000 mg | ORAL_TABLET | Freq: Every day | ORAL | Status: DC
  Administered 2018-06-11: 40 mg via ORAL
  Filled 2018-06-09 (×2): qty 4

## 2018-06-09 MED ORDER — GLUCERNA 1.5 CAL PO LIQD
1000.0000 mL | ORAL | Status: DC
  Administered 2018-06-09: 1000 mL

## 2018-06-09 MED ORDER — VANCOMYCIN HCL IN DEXTROSE 750-5 MG/150ML-% IV SOLN
750.0000 mg | Freq: Two times a day (BID) | INTRAVENOUS | Status: DC
  Administered 2018-06-09: 750 mg via INTRAVENOUS
  Filled 2018-06-09 (×2): qty 150

## 2018-06-09 MED ORDER — SODIUM CHLORIDE 0.9 % IV SOLN
INTRAVENOUS | Status: DC
  Administered 2018-06-09 – 2018-06-10 (×2): via INTRAVENOUS

## 2018-06-09 MED ORDER — CLONIDINE HCL 0.3 MG/24HR TD PTWK: 0.3000 mg | MEDICATED_PATCH | TRANSDERMAL | Status: DC

## 2018-06-09 MED ORDER — ONDANSETRON HCL 4 MG/2ML IJ SOLN: 4.0000 mg | Freq: Four times a day (QID) | INTRAMUSCULAR | Status: DC | PRN

## 2018-06-09 MED ORDER — INSULIN ASPART 100 UNIT/ML ~~LOC~~ SOLN
0.0000 [IU] | Freq: Three times a day (TID) | SUBCUTANEOUS | Status: DC
  Administered 2018-06-09: 2 [IU] via SUBCUTANEOUS
  Administered 2018-06-09: 1 [IU] via SUBCUTANEOUS
  Administered 2018-06-10: 2 [IU] via SUBCUTANEOUS
  Administered 2018-06-10: 3 [IU] via SUBCUTANEOUS
  Administered 2018-06-11: 5 [IU] via SUBCUTANEOUS
  Administered 2018-06-11: 3 [IU] via SUBCUTANEOUS
  Filled 2018-06-09 (×6): qty 1

## 2018-06-09 MED ORDER — METOPROLOL TARTRATE 50 MG PO TABS
50.0000 mg | ORAL_TABLET | Freq: Two times a day (BID) | ORAL | Status: DC
  Administered 2018-06-09 – 2018-06-11 (×3): 50 mg
  Filled 2018-06-09 (×4): qty 1

## 2018-06-09 MED ORDER — FREE WATER
100.0000 mL | Freq: Every day | Status: DC
  Administered 2018-06-09 – 2018-06-10 (×2): 100 mL

## 2018-06-09 MED ORDER — HYDROCODONE-ACETAMINOPHEN 5-325 MG PO TABS
1.0000 | ORAL_TABLET | ORAL | Status: DC | PRN
  Administered 2018-06-10: 2 via ORAL
  Filled 2018-06-09: qty 2

## 2018-06-09 MED ORDER — PIPERACILLIN-TAZOBACTAM 3.375 G IVPB
3.3750 g | Freq: Three times a day (TID) | INTRAVENOUS | Status: DC
  Administered 2018-06-09 (×2): 3.375 g via INTRAVENOUS
  Filled 2018-06-09 (×2): qty 50

## 2018-06-09 MED ORDER — ACETAMINOPHEN 650 MG RE SUPP: 650.0000 mg | Freq: Four times a day (QID) | RECTAL | Status: DC | PRN

## 2018-06-09 MED ORDER — DOCUSATE SODIUM 100 MG PO CAPS
100.0000 mg | ORAL_CAPSULE | Freq: Two times a day (BID) | ORAL | Status: DC
  Administered 2018-06-09: 100 mg via ORAL
  Filled 2018-06-09: qty 1

## 2018-06-09 MED ORDER — ACETAMINOPHEN 325 MG PO TABS: 650.0000 mg | ORAL_TABLET | Freq: Four times a day (QID) | ORAL | Status: DC | PRN

## 2018-06-09 MED ORDER — ADULT MULTIVITAMIN W/MINERALS CH: 1.0000 | ORAL_TABLET | Freq: Every day | ORAL | Status: DC

## 2018-06-09 MED ORDER — GLUCERNA 1.5 CAL PO LIQD
237.0000 mL | Freq: Every day | ORAL | Status: DC
  Administered 2018-06-09 – 2018-06-10 (×2): 237 mL

## 2018-06-09 MED ORDER — INSULIN ASPART 100 UNIT/ML ~~LOC~~ SOLN
0.0000 [IU] | Freq: Every day | SUBCUTANEOUS | Status: DC
  Administered 2018-06-10: 2 [IU] via SUBCUTANEOUS
  Filled 2018-06-09: qty 1

## 2018-06-09 MED ORDER — RIVAROXABAN 15 MG PO TABS
15.0000 mg | ORAL_TABLET | Freq: Every day | ORAL | Status: DC
  Administered 2018-06-09 – 2018-06-10 (×2): 15 mg via ORAL
  Filled 2018-06-09 (×3): qty 1

## 2018-06-09 MED ORDER — MAGNESIUM OXIDE 400 (241.3 MG) MG PO TABS
400.0000 mg | ORAL_TABLET | Freq: Every day | ORAL | Status: DC
  Administered 2018-06-10 – 2018-06-11 (×2): 400 mg via ORAL
  Filled 2018-06-09 (×2): qty 1

## 2018-06-09 MED ORDER — TRAZODONE HCL 50 MG PO TABS
25.0000 mg | ORAL_TABLET | Freq: Every evening | ORAL | Status: DC | PRN
  Administered 2018-06-10: 25 mg via ORAL
  Filled 2018-06-09: qty 1

## 2018-06-09 MED ORDER — ADULT MULTIVITAMIN LIQUID CH: 15.0000 mL | Freq: Every day | ORAL | Status: DC

## 2018-06-09 MED ORDER — FREE WATER
100.0000 mL | Freq: Four times a day (QID) | Status: DC
  Administered 2018-06-09 – 2018-06-10 (×2): 100 mL

## 2018-06-09 NOTE — Progress Notes (Signed)
[facility] - [place] [facility]   PATIENT NAME: [name]    MR#:  [id number]  DATE OF BIRTH:  20-Mar-1961  SUBJECTIVE:   Pt. Here due to N/V and possible complication due to PEG tube.  Patient had Gastrografin study through the PEG tube this morning which shows no obstruction.  Patient has had no further vomiting while in the hospital.  REVIEW OF SYSTEMS:    Review of Systems  Unable to perform ROS: Mental acuity     Nutrition: Tube feeds Tolerating Diet: Yes Tolerating PT: Await Eval.   DRUG ALLERGIES:  No Known Allergies  VITALS:  Blood pressure 123/64, pulse 61, temperature 97.7 F (36.5 C), temperature source Oral, resp. rate 20, height 4\' 11"  (1.499 m), weight 52 kg (114 lb 10.2 oz), SpO2 100 %.  PHYSICAL EXAMINATION:   Physical Exam  GENERAL:  57 y.o.-year-old patient lying in bed in no acute distress.  EYES: Pupils equal, round, reactive to light and accommodation. No scleral icterus. Extraocular muscles intact.  HEENT: Head atraumatic, normocephalic. Oropharynx and nasopharynx clear.  NECK:  Supple, no jugular venous distention. No thyroid enlargement, no tenderness.  LUNGS: Normal breath sounds bilaterally, no wheezing, rales, rhonchi. No use of accessory muscles of respiration.  CARDIOVASCULAR: S1, S2 normal. No murmurs, rubs, or gallops.  ABDOMEN: Soft, nontender, nondistended. Bowel sounds present. No organomegaly or mass. + PEG tube in place.   EXTREMITIES: No cyanosis, clubbing or edema b/l.    NEUROLOGIC: Cranial nerves II through XII are intact. No focal Motor or sensory deficits b/l.  Dense Right sided weakness from previous CVA. Non-verbal.  PSYCHIATRIC: The patient is alert and oriented x 1.  SKIN: No obvious rash, lesion, or ulcer.    LABORATORY PANEL:   CBC Recent Labs  Lab 06/09/18 0638  WBC 10.2  HGB 12.0  HCT 35.2  PLT 134*    ------------------------------------------------------------------------------------------------------------------  Chemistries  Recent Labs  Lab 06/08/18 1926 06/09/18 0638  NA 141 144  K 4.4 3.5  CL 99 112*  CO2 31 27  GLUCOSE 400* 186*  BUN 54* 32*  CREATININE 0.80 0.40*  CALCIUM 8.8* 7.6*  AST 42*  --   ALT 39  --   ALKPHOS 64  --   BILITOT 1.1  --    ------------------------------------------------------------------------------------------------------------------  Cardiac Enzymes No results for input(s): TROPONINI in the last 168 hours. ------------------------------------------------------------------------------------------------------------------  RADIOLOGY:  Ct Chest W Contrast  Result Date: 06/08/2018 CLINICAL DATA:  57 y/o F; brown emesis, hypoxia, fever. Code sepsis. EXAM: CT CHEST, ABDOMEN, AND PELVIS WITH CONTRAST TECHNIQUE: Multidetector CT imaging of the chest, abdomen and pelvis was performed following the standard protocol during bolus administration of intravenous contrast. CONTRAST:  100mL OMNIPAQUE IOHEXOL 300 MG/ML  SOLN COMPARISON:  06/28/2012 CT abdomen and pelvis.  06/05/2012 CT chest. FINDINGS: CT CHEST FINDINGS Cardiovascular: Normal heart size. No pericardial effusion. Aortic and severe coronary artery calcific atherosclerosis. Mediastinum/Nodes: No enlarged mediastinal, hilar, or axillary lymph nodes. Thyroid gland, trachea, and esophagus demonstrate no significant findings. Lungs/Pleura: Minor dependent ground-glass opacities in the lungs compatible with atelectasis. No pleural effusion or pneumothorax. No consolidation Musculoskeletal: No acute fracture. CT ABDOMEN PELVIS FINDINGS Hepatobiliary: No focal liver abnormality is seen. No gallstones, gallbladder wall thickening, or biliary dilatation. Pancreas: Unremarkable. No pancreatic ductal dilatation or surrounding inflammatory changes. Spleen: Normal in size without focal abnormality. Adrenals/Urinary  Tract: Normal adrenal glands. No focal kidney lesion. Multiple punctate nonobstructing stones in the right kidney. No hydronephrosis. Mild bladder  distention. Stomach/Bowel: Percutaneous gastrostomy catheter with the balloon expanded in the proximal jejunum. Upstream distention of the stomach and duodenum. Large volume of stool in the colon and rectum compatible with constipation. Normal appendix. Vascular/Lymphatic: Aortic atherosclerosis. No enlarged abdominal or pelvic lymph nodes. IVC filter in situ. Reproductive: Uterus and bilateral adnexa are unremarkable. Other: No abdominal wall hernia or abnormality. No abdominopelvic ascites. Musculoskeletal: No fracture is seen. IMPRESSION: 1. The gastrostomy tube is advanced into the proximal jejunum where the balloon is expanded. Upstream the duodenum and stomach are distended with fluid indicating obstruction. Additionally, there is wall thickening of the distal duodenum probably due to compression from the gastrostomy catheter and/or associated inflammation. 2. Right kidney nonobstructive punctate nephrolithiasis. 3. Moderate aortic and severe coronary artery calcific atherosclerosis. 4. Large volume of stool in the distal colon and rectum, probably constipation or chronic dysmotility. These results were called by telephone at the time of interpretation on 06/08/2018 at 9:50 pm to Dr. Sharman Cheek , who verbally acknowledged these results. Electronically Signed   By: Mitzi Hansen M.D.   On: 06/08/2018 21:55   Ct Abdomen Pelvis W Contrast  Result Date: 06/08/2018 CLINICAL DATA:  57 y/o F; brown emesis, hypoxia, fever. Code sepsis. EXAM: CT CHEST, ABDOMEN, AND PELVIS WITH CONTRAST TECHNIQUE: Multidetector CT imaging of the chest, abdomen and pelvis was performed following the standard protocol during bolus administration of intravenous contrast. CONTRAST:  OMNIPAQUE IOHEXOL 300 MG/ML  SOLN COMPARISON:  06/28/2012 CT abdomen and pelvis.   06/05/2012 CT chest. FINDINGS: CT CHEST FINDINGS Cardiovascular: Normal heart size. No pericardial effusion. Aortic and severe coronary artery calcific atherosclerosis. Mediastinum/Nodes: No enlarged mediastinal, hilar, or axillary lymph nodes. Thyroid gland, trachea, and esophagus demonstrate no significant findings. Lungs/Pleura: Minor dependent ground-glass opacities in the lungs compatible with atelectasis. No pleural effusion or pneumothorax. No consolidation Musculoskeletal: No acute fracture. CT ABDOMEN PELVIS FINDINGS Hepatobiliary: No focal liver abnormality is seen. No gallstones, gallbladder wall thickening, or biliary dilatation. Pancreas: Unremarkable. No pancreatic ductal dilatation or surrounding inflammatory changes. Spleen: Normal in size without focal abnormality. Adrenals/Urinary Tract: Normal adrenal glands. No focal kidney lesion. Multiple punctate nonobstructing stones in the right kidney. No hydronephrosis. Mild bladder distention. Stomach/Bowel: Percutaneous gastrostomy catheter with the balloon expanded in the proximal jejunum. Upstream distention of the stomach and duodenum. Large volume of stool in the colon and rectum compatible with constipation. Normal appendix. Vascular/Lymphatic: Aortic atherosclerosis. No enlarged abdominal or pelvic lymph nodes. IVC filter in situ. Reproductive: Uterus and bilateral adnexa are unremarkable. Other: No abdominal wall hernia or abnormality. No abdominopelvic ascites. Musculoskeletal: No fracture is seen. IMPRESSION: 1. The gastrostomy tube is advanced into the proximal jejunum where the balloon is expanded. Upstream the duodenum and stomach are distended with fluid indicating obstruction. Additionally, there is wall thickening of the distal duodenum probably due to compression from the gastrostomy catheter and/or associated inflammation. 2. Right kidney nonobstructive punctate nephrolithiasis. 3. Moderate aortic and severe coronary artery calcific  atherosclerosis. 4. Large volume of stool in the distal colon and rectum, probably constipation or chronic dysmotility. These results were called by telephone at the time of interpretation on 06/08/2018 at 9:50 pm to Dr. Sharman Cheek , who verbally acknowledged these results. Electronically Signed   By: Mitzi Hansen M.D.   On: 06/08/2018 21:55   Dg Abdomen Peg Tube Location  Result Date: 06/09/2018 CLINICAL DATA:  Vomiting after PEG tube placement. EXAM: ABDOMEN - 1 VIEW COMPARISON:  CT abdomen and pelvis 06/08/2018. FINDINGS: The stomach  has been filled with contrast. There is no evidence of extraluminal contrast leakage. The stomach appears well distended. IVC filter appears suprarenal. Radiodense sponge overlies the lower abdomen. Moderate stool burden. IMPRESSION: No adverse features related to PEG placement or repositioning. Electronically Signed   By: Elsie Stain M.D.   On: 06/09/2018 13:03   Dg Chest Port 1 View  Result Date: 06/08/2018 CLINICAL DATA:  Manson Passey emesis EXAM: PORTABLE CHEST 1 VIEW COMPARISON:  06/19/2012 FINDINGS: Artifact from EKG leads. An IVC filter is present. Low volume chest with interstitial crowding. There is no edema, consolidation, effusion, or pneumothorax. Prominent asymmetric left shoulder girdle osteopenia. Patient has history of hemiplegia. IMPRESSION: Limited low volume chest without definite or focal abnormality. Electronically Signed   By: Marnee Spring M.D.   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  1.  Nausea/vomiting-suspected to be secondary to a PEG tube complication.  Patient CT scan of the abdomen pelvis was suggestive of data on admission.  GI consult obtained and they  recommended a Gastrografin study through the PEG tube.  The study showed no evidence of obstruction or poor positioning of the PEG tube.  Will continue tube feeds as tolerated.  Nausea vomiting has now resolved.  2.  Sepsis- patient presented to the hospital with a fever, leukocytosis and nausea vomiting and suspected to have a aspiration pneumonia.  We will continue IV antibiotics and narrowed down to just Unasyn.  DC vancomycin. -Currently afebrile, white cell count is improved.  Hemodynamically stable.  3.  Aspiration pneumonia- source of patient's sepsis.  Continue IV Unasyn, follow cultures.  Tube feedings can now be resumed as no obstruction of the PEG tube based on today's Gastrografin study.  4.  Diabetes type 2 without complication-continue Lantus, NovoLog sliding scale.  Blood sugar stable.  5.  Essential hypertension-continue clonidine patch, Norvasc, Metoprolol  6.  Chronic atrial fibrillation-rate controlled.  Continue metoprolol, continue Xarelto.   All the records are reviewed and case discussed with Care Management/Social Worker. Management plans discussed with the patient, family and they are in agreement.  CODE STATUS: Full code  DVT Prophylaxis: Xarelto  TOTAL TIME TAKING CARE OF THIS PATIENT: 30 minutes.   POSSIBLE D/C IN 1-2 DAYS, DEPENDING ON CLINICAL CONDITION.   Houston Siren M.D on 06/09/2018 at 3:04 PM  Between 7am to 6pm - Pager - [phone]  After 6pm go to [email or web address] - Social research officer, government  [facility] [place] [facility]  Office  [phone]  CC: Primary care physician; Derwood Kaplan, MD

## 2018-06-09 NOTE — Consult Note (Signed)
Melodie Bouillon, MD [address], [place], [place], [address] [address], [address], [place], [place], [address] Phone: [phone]  Fax: [phone]  Consultation  Referring Provider:     Dr. Cherlynn Kaiser Primary Care Physician:  Derwood Kaplan, MD Reason for Consultation:     PEG malfunction  Date of Admission:  06/08/2018 Date of Consultation:  06/09/2018         HPI:   [name] is a 57 y.o. female with a history of PEG tube placement by Dr. Marva Panda and Dr. Anda Kraft in July 2013.  History of CVA, hemiplegia, aphasia. history unable to be obtained from patient due to her aphasia, history obtained from chart.  Patient presented with nausea and vomiting for the past 2 days.  CT reported gastrostomy tube balloon to be in the proximal jejunum, with upstream distention of the duodenum and stomach.  As per ED report after the CT was done, "Viewing  the CT images, I estimated that the PEG tube balloon needed to be retracted by about 6 cm.  I deflated the balloon, gently withdrew the tubing by 6 cm which was achieved without resistance.  Gently reinflated the balloon, again there was no resistance.  After doing so I was able to retract the tubing even more with the balloon inflated until it produced  a stop on its own confirming that it was in the stomach."  This was done by Dr. Scotty Court.  No further nausea or vomiting reported by the nurse after this.  Past Medical History:  Diagnosis Date  . Aphasia   . CVA (cerebral vascular accident) (HCC)   . Diabetes mellitus without complication (HCC)   . GERD (gastroesophageal reflux disease)   . Hemiplegia (HCC)   . Hypertension   . Lack of coordination   . Vertigo     Past Surgical History:  Procedure Laterality Date  . IVC FILTER INSERTION N/A 08/28/2017   Procedure: IVC FILTER INSERTION;  Surgeon: Renford Dills, MD;  Location: [facility] INVASIVE CV [facility];  Service: Cardiovascular;  Laterality: N/A;  . PERIPHERAL VASCULAR  THROMBECTOMY Left 08/28/2017   Procedure: PERIPHERAL VASCULAR THROMBECTOMY;  Surgeon: Renford Dills, MD;  Location: [facility] INVASIVE CV [facility];  Service: Cardiovascular;  Laterality: Left;    Prior to Admission medications   Medication Sig Start Date End Date Taking? Authorizing Provider  amLODipine (NORVASC) 10 MG tablet Place 10 mg into feeding tube at bedtime.    Yes [provider]  bisacodyl (DULCOLAX) 10 MG suppository Place 10 mg rectally daily as needed for moderate constipation.   Yes [provider]  cloNIDine (CATAPRES - DOSED IN MG/24 HR) 0.3 mg/24hr patch Place 0.3 mg onto the skin every Thursday at 6pm.    Yes [provider]  insulin aspart (NOVOLOG) 100 UNIT/ML injection Inject three times daily with meals and at bedtime per sliding scale:  0-200: 0u 201-250: 2u 251-300: 4u 301-350: 6u 351-400: 8u 401-450: 10u +450: Contact MD   Yes [provider]  insulin glargine (LANTUS) 100 UNIT/ML injection Inject 20 Units into the skin at bedtime.    Yes [provider]  lisinopril (PRINIVIL,ZESTRIL) 40 MG tablet Take 40 mg by mouth daily.   Yes [provider]  magnesium oxide (MAG-OX) 400 MG tablet Take 400 mg by mouth daily.   Yes [provider]  Menthol, Topical Analgesic, (BIOFREEZE) 4 % GEL Apply 1 application topically every 8 (eight) hours as needed. Apply to left arm, shoulder,  and elbow   Yes [provider]  metoprolol tartrate (LOPRESSOR) 50 MG tablet Place 50 mg into feeding tube 2 (two) times daily.   Yes [provider]  Multiple Vitamin (MULTIVITAMIN) LIQD Place 15 mLs into feeding tube daily.   Yes [provider]  omeprazole (PRILOSEC) 2 mg/mL SUSP Place 10 mg into feeding tube daily.   Yes [provider]  ondansetron (ZOFRAN) 4 MG tablet Place 4 mg into feeding tube every 6 (six) hours as needed for nausea or vomiting.   Yes [provider]  polyethylene  glycol (MIRALAX / GLYCOLAX) packet Place 17 g into feeding tube at bedtime.    Yes [provider]  Rivaroxaban (XARELTO) 15 MG TABS tablet Take 15 mg by mouth daily.   Yes [provider]    No family history on file.   Social History   Tobacco Use  . Smoking status: Never Smoker  . Smokeless tobacco: Never Used  Substance Use Topics  . Alcohol use: No  . Drug use: Not on file    Allergies as of 06/08/2018  . (No Known Allergies)    Review of Systems:    All systems reviewed and negative except where noted in HPI.   Physical Exam:  Vital signs in last 24 hours: Vitals:   06/09/18 0500 06/09/18 0556 06/09/18 0558 06/09/18 1146  BP:  116/77  123/64  Pulse:  77  61  Resp:    20  Temp:   97.6 F (36.4 C) 97.7 F (36.5 C)  TempSrc:   Oral Oral  SpO2:  100%  100%  Weight: 114 lb 10.2 oz (52 kg)     Height:       Last BM Date: 06/09/18 General:   Pleasant, cooperative in NAD Head:  Normocephalic and atraumatic. Eyes:   No icterus.   Conjunctiva pink. PERRLA. Ears:  Normal auditory acuity. Neck:  Supple; no masses or thyroidomegaly Lungs: Respirations even and unlabored. Lungs clear to auscultation bilaterally.   No wheezes, crackles, or rhonchi.  Abdomen:  Soft, nondistended, nontender. Normal bowel sounds. No appreciable masses or hepatomegaly.  No rebound or guarding.  Feeding tube in place, with no discharge or erythema or tenderness at the tube site.  To be able to be moved in and out gently, and rotated, without leading to pain, or any difficulty. Skin:  Intact without significant lesions or rashes. Cervical Nodes:  No significant cervical adenopathy. Psych:  Alert and cooperative. Normal affect.  LAB RESULTS: Recent Labs    06/08/18 1926 06/09/18 0638  WBC 16.2* 10.2  HGB 14.8 12.0  HCT 44.4 35.2  PLT 226 134*   BMET Recent Labs    06/08/18 1926 06/09/18 0638  NA 141 144  K 4.4 3.5  CL 99 112*  CO2 31 27  GLUCOSE 400* 186*  BUN  54* 32*  CREATININE 0.80 0.40*  CALCIUM 8.8* 7.6*   LFT Recent Labs    06/08/18 1926  PROT 8.0  ALBUMIN 3.3*  AST 42*  ALT 39  ALKPHOS 64  BILITOT 1.1   PT/INR Recent Labs    06/08/18 1926  LABPROT 13.8  INR 1.07    STUDIES: Ct Chest W Contrast  Result Date: 06/08/2018 CLINICAL DATA:  57 y/o F; brown emesis, hypoxia, fever. Code sepsis. EXAM: CT CHEST, ABDOMEN, AND PELVIS WITH CONTRAST TECHNIQUE: Multidetector CT imaging of the chest, abdomen and pelvis was performed following the standard protocol during bolus administration of intravenous contrast. CONTRAST:  100mL OMNIPAQUE IOHEXOL 300 MG/ML  SOLN COMPARISON:  06/28/2012 CT abdomen and pelvis.  06/05/2012 CT chest. FINDINGS: CT CHEST FINDINGS Cardiovascular: Normal heart size. No pericardial effusion. Aortic and severe coronary artery calcific atherosclerosis. Mediastinum/Nodes: No enlarged mediastinal, hilar, or axillary lymph nodes. Thyroid gland, trachea, and esophagus demonstrate no significant findings. Lungs/Pleura: Minor dependent ground-glass opacities in the lungs compatible with atelectasis. No pleural effusion or pneumothorax. No consolidation Musculoskeletal: No acute fracture. CT ABDOMEN PELVIS FINDINGS Hepatobiliary: No focal liver abnormality is seen. No gallstones, gallbladder wall thickening, or biliary dilatation. Pancreas: Unremarkable. No pancreatic ductal dilatation or surrounding inflammatory changes. Spleen: Normal in size without focal abnormality. Adrenals/Urinary Tract: Normal adrenal glands. No focal kidney lesion. Multiple punctate nonobstructing stones in the right kidney. No hydronephrosis. Mild bladder distention. Stomach/Bowel: Percutaneous gastrostomy catheter with the balloon expanded in the proximal jejunum. Upstream distention of the stomach and duodenum. Large volume of stool in the colon and rectum compatible with constipation. Normal appendix. Vascular/Lymphatic: Aortic atherosclerosis. No enlarged  abdominal or pelvic lymph nodes. IVC filter in situ. Reproductive: Uterus and bilateral adnexa are unremarkable. Other: No abdominal wall hernia or abnormality. No abdominopelvic ascites. Musculoskeletal: No fracture is seen. IMPRESSION: 1. The gastrostomy tube is advanced into the proximal jejunum where the balloon is expanded. Upstream the duodenum and stomach are distended with fluid indicating obstruction. Additionally, there is wall thickening of the distal duodenum probably due to compression from the gastrostomy catheter and/or associated inflammation. 2. Right kidney nonobstructive punctate nephrolithiasis. 3. Moderate aortic and severe coronary artery calcific atherosclerosis. 4. Large volume of stool in the distal colon and rectum, probably constipation or chronic dysmotility. These results were called by telephone at the time of interpretation on 06/08/2018 at 9:50 pm to Dr. Sharman CheekPHILLIP STAFFORD , who verbally acknowledged these results. Electronically Signed   By: Mitzi HansenLance  Furusawa-Stratton M.D.   On: 06/08/2018 21:55   Ct Abdomen Pelvis W Contrast  Result Date: 06/08/2018 CLINICAL DATA:  57 y/o F; brown emesis, hypoxia, fever. Code sepsis. EXAM: CT CHEST, ABDOMEN, AND PELVIS WITH CONTRAST TECHNIQUE: Multidetector CT imaging of the chest, abdomen and pelvis was performed following the standard protocol during bolus administration of intravenous contrast. CONTRAST:  100mL OMNIPAQUE IOHEXOL 300 MG/ML  SOLN COMPARISON:  06/28/2012 CT abdomen and pelvis.  06/05/2012 CT chest. FINDINGS: CT CHEST FINDINGS Cardiovascular: Normal heart size. No pericardial effusion. Aortic and severe coronary artery calcific atherosclerosis. Mediastinum/Nodes: No enlarged mediastinal, hilar, or axillary lymph nodes. Thyroid gland, trachea, and esophagus demonstrate no significant findings. Lungs/Pleura: Minor dependent ground-glass opacities in the lungs compatible with atelectasis. No pleural effusion or pneumothorax. No  consolidation Musculoskeletal: No acute fracture. CT ABDOMEN PELVIS FINDINGS Hepatobiliary: No focal liver abnormality is seen. No gallstones, gallbladder wall thickening, or biliary dilatation. Pancreas: Unremarkable. No pancreatic ductal dilatation or surrounding inflammatory changes. Spleen: Normal in size without focal abnormality. Adrenals/Urinary Tract: Normal adrenal glands. No focal kidney lesion. Multiple punctate nonobstructing stones in the right kidney. No hydronephrosis. Mild bladder distention. Stomach/Bowel: Percutaneous gastrostomy catheter with the balloon expanded in the proximal jejunum. Upstream distention of the stomach and duodenum. Large volume of stool in the colon and rectum compatible with constipation. Normal appendix. Vascular/Lymphatic: Aortic atherosclerosis. No enlarged abdominal or pelvic lymph nodes. IVC filter in situ. Reproductive: Uterus and bilateral adnexa are unremarkable. Other: No abdominal wall hernia or abnormality. No abdominopelvic ascites. Musculoskeletal: No fracture is seen. IMPRESSION: 1. The gastrostomy tube is advanced into the proximal jejunum where the balloon is expanded. Upstream the duodenum  and stomach are distended with fluid indicating obstruction. Additionally, there is wall thickening of the distal duodenum probably due to compression from the gastrostomy catheter and/or associated inflammation. 2. Right kidney nonobstructive punctate nephrolithiasis. 3. Moderate aortic and severe coronary artery calcific atherosclerosis. 4. Large volume of stool in the distal colon and rectum, probably constipation or chronic dysmotility. These results were called by telephone at the time of interpretation on 06/08/2018 at 9:50 pm to Dr. Sharman Cheek , who verbally acknowledged these results. Electronically Signed   By: Mitzi Hansen M.D.   On: 06/08/2018 21:55   Dg Chest Port 1 View  Result Date: 06/08/2018 CLINICAL DATA:  Manson Passey emesis EXAM: PORTABLE  CHEST 1 VIEW COMPARISON:  06/19/2012 FINDINGS: Artifact from EKG leads. An IVC filter is present. Low volume chest with interstitial crowding. There is no edema, consolidation, effusion, or pneumothorax. Prominent asymmetric left shoulder girdle osteopenia. Patient has history of hemiplegia. IMPRESSION: Limited low volume chest without definite or focal abnormality. Electronically Signed   By: Marnee Spring M.D.   On: 06/08/2018 20:39      Impression / Plan:   [name] is a 57 y.o. y/o female with presentation for nausea and vomiting, with history of PEG tube placement in 2013, with CT showing displacement of internal PEG tube balloon, which was then retracted by ED physician Dr. Scotty Court, with resolution of symptoms at this time  Would recommend Gastrografin study via PEG tube to confirm placement in the stomach If PEG tube internal bumper is correctly found to be in the stomach after above study, no further intervention needed at this time  The inflammation reported in the distal duodenum, may be due to compression from the displaced balloon at the site itself Patient does not have any abdominal pain, or no signs of active GI bleeding at this time No indication for urgent needed HD An Elective EGD can be considered in the future if one is needed to evaluate the site Any inflammation at the site is likely to improve with PPI without putting the patient the risks of the procedure  Given her comorbidities, conservative management is beneficial if PEG tube is in place (pending Gastrografin study) at this time, as clinical symptoms of nausea and vomiting have resolved As sedation with endoscopy can lead to risks in this patient, continue conservative management until PEG tube placement can be confirmed  Continue PPI twice daily for 30 days and then discontinue Follow-up with primary gastroenterologist, Dr. Marva Panda as an outpatient on discharge  Will await Gastrografin study  Thank you  for involving me in the care of this patient.      LOS: 1 day   Pasty Spillers, MD  06/09/2018, 12:46 PM

## 2018-06-09 NOTE — Progress Notes (Signed)
Initial Nutrition Assessment  DOCUMENTATION CODES:   Non-severe (moderate) malnutrition in context of chronic illness  INTERVENTION:   Once TF resumed, recommend bolus feeding: - Glucerna 1.5 237 mL 5 times daily (5 cans daily) via G-tube - 50 mL free water flush before and after each bolus feeding - Additional free water flush of 100 mL 4 times daily  Recommended tube feeding regimen with free water flushes provides 1778 kcal, 98 grams protein, and 1800 mL free water daily.  NUTRITION DIAGNOSIS:   Moderate Malnutrition related to chronic illness(CVA resulting in chronic aphasia, hemiplegia, and dysphagia) as evidenced by mild fat depletion, mild muscle depletion, moderate muscle depletion, percent weight loss(25.9% weight loss in < 10 months).  GOAL:   Patient will meet greater than or equal to 90% of their needs  MONITOR:   Labs, I & O's, Weight trends, Other (Comment)(restarting TF)  REASON FOR ASSESSMENT:   Consult Assessment of nutrition requirement/status  ASSESSMENT:   57 year old female who presented from [facility] with hematemesis. Pt is nonverbal at baseline. 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 Pt with G-tube in place that was found to be advanced into proximal jejunum and repositioned in the ED.   No family present at time of RD visit. Pt lethargic and difficult to arouse. RD unable to obtain diet history at time time as no family present.  During previous admission in September 2018, noted that pt's TF regimen from [facility] was Glucerna 1.5 237 mL bolus 5 times daily (5 cans daily). This provides 1778 kcal, 98 grams protein, and 900 mL free water. RD recommends continuing this regimen as long as pt is tolerating it at [facility].  Per weight history in chart, pt has lost 48 lbs since 08/29/17. This is a 25.9% weight loss in less than 10 months  which is severe and significant for timeframe.  Medications reviewed and include: 100 mg Colace BID, sliding scale Novolog, 20 units Lantus daily, 400 mg magnesium oxide daily, MVI with minerals daily, 20 mg Protonix daily, IV antibiotics  Labs reviewed: BUN 32 (H), creatinine 0.40 (L), calcium 7.6 (L) CBG's: 169, 164  NUTRITION - FOCUSED PHYSICAL EXAM:    Most Recent Value  Orbital Region  Mild depletion  Upper Arm Region  Mild depletion  Thoracic and Lumbar Region  Unable to assess  Buccal Region  No depletion  Temple Region  Mild depletion  Clavicle Bone Region  Mild depletion  Clavicle and Acromion Bone Region  Mild depletion  Scapular Bone Region  Unable to assess  Dorsal Hand  Mild depletion  Patellar Region  Moderate depletion  Anterior Thigh Region  Moderate depletion  Posterior Calf Region  Moderate depletion  Edema (RD Assessment)  None  Hair  Reviewed  Eyes  Unable to assess  Mouth  Unable to assess  Skin  Reviewed  Nails  Reviewed       Diet Order:   Diet Order           Diet NPO time specified  Diet effective now          EDUCATION NEEDS:   Not appropriate for education at this time  Skin:  Skin Assessment: Reviewed RN Assessment  Last BM:  06/09/18 smear type 5  Height:   Ht Readings from Last 1 Encounters:  06/08/18 4\' 11"  (1.499 m)    Weight:   Wt Readings from Last 1 Encounters:  06/09/18 114 lb 10.2 oz (52 kg)    Ideal Body Weight:  43.18 kg  BMI:  Body mass index is 23.15 kg/m.  Estimated Nutritional Needs:   Kcal:  1560-1768 kcal/day (30-34 kcal/kg)  Protein:  68-83 grams/day (1.3-1.6 g/kg)  Fluid:  1.6-1.8 L/day    Earma ReadingKate Jablonski Sharelle Burditt, MS, RD, LDN Pager: [phone] Weekend/After Hours: [phone]

## 2018-06-09 NOTE — ED Notes (Signed)
Pt cleansed of incontinent urine and stool.  

## 2018-06-09 NOTE — Progress Notes (Signed)
CODE SEPSIS - PHARMACY COMMUNICATION  **Broad Spectrum Antibiotics should be administered within 1 hour of Sepsis diagnosis**  Time Code Sepsis Called/Page Received: 1940  Antibiotics Ordered: vanc/cefepime  Time of 1st antibiotic administration: 1943  Additional action taken by pharmacy:   If necessary, Name of Provider/Nurse Contacted:     Thomasene Rippleavid  Furious Chiarelli ,PharmD Clinical Pharmacist  06/09/2018  12:09 AM

## 2018-06-09 NOTE — Progress Notes (Signed)
PEG tube check radiology report reviewed and notes stomach filled with contrast. Images reviewed as well and contrast injected via PEG tube appears to be in the stomach. Report also states no leak noted.  Since symptoms have resolved, ok to resume feeds as per nutrition recommendations. If symptoms reoccur, please notify GI.   No indication for EGD or PEG tube replacement at this time.   Keep external PEG tube bumper lightly touching the abdominal wall skin, I.e. About 1 fingerwidth or 1 cm away from the skin.

## 2018-06-09 NOTE — Progress Notes (Signed)
Pharmacy Antibiotic Note  [name] is a 57 y.o. female admitted on 06/08/2018 with pneumonia.  Pharmacy has been consulted for vanc/zosyn dosing. Patient received vanc 1g and cefepime 2g IV x 1 in ED  Plan: Will continue vanc 750 mg IV q12h w/ 8 hour stack  Will draw trough 07/09 @ 1500 prior to 4th dose. Will start zosyn 3.375g IV q8h  Ke 0.05503 T1/2 12 hrs Goal trough 15 - 20 mcg/mL  Height: 4\' 11"  (149.9 cm) Weight: 124 lb 12.5 oz (56.6 kg) IBW/kg (Calculated) : 43.2  Temp (24hrs), Avg:100.2 F (37.9 C), Min:99.6 F (37.6 C), Max:100.8 F (38.2 C)  Recent Labs  Lab 06/08/18 1926 06/08/18 2142  WBC 16.2*  --   CREATININE 0.80  --   LATICACIDVEN 3.3* 2.6*    Estimated Creatinine Clearance: 61 mL/min (by C-G formula based on SCr of 0.8 mg/dL).    No Known Allergies  Thank you for allowing pharmacy to be a part of this patient's care.  Thomasene Rippleavid Shadavia Dampier, PharmD, BCPS Clinical Pharmacist /06/09/2018

## 2018-06-10 LAB — BASIC METABOLIC PANEL
Anion gap: 6 (ref 5–15)
BUN: 15 mg/dL (ref 6–20)
CALCIUM: 8.3 mg/dL — AB (ref 8.9–10.3)
CHLORIDE: 112 mmol/L — AB (ref 98–111)
CO2: 28 mmol/L (ref 22–32)
CREATININE: 0.34 mg/dL — AB (ref 0.44–1.00)
GFR calc non Af Amer: >60 mL/min (ref >60)
Glucose, Bld: 178 mg/dL — ABNORMAL HIGH (ref 70–99)
Potassium: 3.3 mmol/L — ABNORMAL LOW (ref 3.5–5.1)
SODIUM: 146 mmol/L — AB (ref 135–145)

## 2018-06-10 LAB — CBC
HCT: 34.5 % — ABNORMAL LOW (ref 35.0–47.0)
Hemoglobin: 11.7 g/dL — ABNORMAL LOW (ref 12.0–16.0)
MCH: 33.3 pg (ref 26.0–34.0)
MCHC: 33.9 g/dL (ref 32.0–36.0)
MCV: 98.2 fL (ref 80.0–100.0)
Platelets: 123 K/uL — ABNORMAL LOW (ref 150–440)
RBC: 3.52 MIL/uL — ABNORMAL LOW (ref 3.80–5.20)
RDW: 14.5 % (ref 11.5–14.5)
WBC: 4.9 K/uL (ref 3.6–11.0)

## 2018-06-10 LAB — GLUCOSE, CAPILLARY
GLUCOSE-CAPILLARY: 180 mg/dL — AB (ref 70–99)
Glucose-Capillary: 108 mg/dL — ABNORMAL HIGH (ref 70–99)
Glucose-Capillary: 216 mg/dL — ABNORMAL HIGH (ref 70–99)
Glucose-Capillary: 220 mg/dL — ABNORMAL HIGH (ref 70–99)

## 2018-06-10 LAB — URINE CULTURE: CULTURE: NO GROWTH

## 2018-06-10 LAB — MAGNESIUM: MAGNESIUM: 1.7 mg/dL (ref 1.7–2.4)

## 2018-06-10 LAB — HIV ANTIBODY (ROUTINE TESTING W REFLEX): HIV SCREEN 4TH GENERATION: NONREACTIVE

## 2018-06-10 LAB — PHOSPHORUS: Phosphorus: 2.1 mg/dL — ABNORMAL LOW (ref 2.5–4.6)

## 2018-06-10 MED ORDER — K PHOS MONO-SOD PHOS DI & MONO 155-852-130 MG PO TABS
500.0000 mg | ORAL_TABLET | Freq: Two times a day (BID) | ORAL | Status: AC
  Administered 2018-06-10 – 2018-06-11 (×2): 500 mg via ORAL
  Filled 2018-06-10 (×2): qty 2

## 2018-06-10 MED ORDER — POTASSIUM CHLORIDE CRYS ER 20 MEQ PO TBCR
40.0000 meq | EXTENDED_RELEASE_TABLET | Freq: Once | ORAL | Status: AC
  Administered 2018-06-10: 40 meq via ORAL
  Filled 2018-06-10: qty 2

## 2018-06-10 MED ORDER — ADULT MULTIVITAMIN LIQUID CH
15.0000 mL | Freq: Every day | ORAL | Status: DC
  Administered 2018-06-11: 15 mL
  Filled 2018-06-10: qty 15

## 2018-06-10 MED ORDER — DOCUSATE SODIUM 50 MG/5ML PO LIQD
100.0000 mg | Freq: Two times a day (BID) | ORAL | Status: DC
  Administered 2018-06-10 – 2018-06-11 (×3): 100 mg via ORAL
  Filled 2018-06-10 (×4): qty 10

## 2018-06-10 MED ORDER — GLUCERNA 1.5 CAL PO LIQD: 1000.0000 mL | ORAL | Status: AC

## 2018-06-10 MED ORDER — FREE WATER
100.0000 mL | Status: DC
  Administered 2018-06-10 – 2018-06-11 (×7): 100 mL

## 2018-06-10 NOTE — Clinical Social Work Note (Signed)
CSW aware patient is long term care at [facility]. CSW has left patient's son: [name]: [phone] a message. York SpanielMonica Faizon Capozzi MSW,LCSW [phone]

## 2018-06-10 NOTE — Progress Notes (Signed)
PEG tube is functioning well, currently on tube feeds at 85 mL per hour, tolerating them well. Abdomen is benign, soft, nontender  GI will sign off for now  Arlyss Repressohini R Kerrington Sova, MD [address]  [address]  [place], [place] [address]  Main: [phone]  Fax: [phone] Pager: [phone]

## 2018-06-10 NOTE — [place] FL2 (Signed)
[place] MEDICAID FL2 LEVEL OF CARE SCREENING TOOL     IDENTIFICATION  Patient Name: [name] Birthdate: 12/05/1960 Sex: female Admission Date (Current Location): 06/08/2018  [facility] and [place] Number:  Chiropodist and Address:  [facility], [address] [place], [place] [address]      Provider Number: [id number]  Attending Physician Name and Address:  Houston Siren, MD  Relative Name and Phone Number:       Current Level of Care: Hospital Recommended Level of Care: Skilled Nursing Facility Prior Approval Number:    Date Approved/Denied:   PASRR Number:    Discharge Plan: SNF    Current Diagnoses: Patient Active Problem List   Diagnosis Date Noted  . Malnutrition of moderate degree 06/09/2018  . Sepsis (HCC) 06/08/2018  . Tachycardia 08/27/2017  . Acute deep vein thrombosis (DVT) of left lower extremity (HCC) 08/22/2017  . Type 2 diabetes mellitus with complication (HCC) 08/22/2017  . Essential hypertension 08/22/2017    Orientation RESPIRATION BLADDER Height & Weight     Self  O2(2.5 liters) Incontinent Weight: 114 lb 10.2 oz (52 kg) Height:  4\' 11"  (149.9 cm)  BEHAVIORAL SYMPTOMS/MOOD NEUROLOGICAL BOWEL NUTRITION STATUS  (none) (none) Incontinent Feeding tube  AMBULATORY STATUS COMMUNICATION OF NEEDS Skin   Total Care Non-Verbally Normal                       Personal Care Assistance Level of Assistance  Total care           Functional Limitations Info  Hearing, Speech   Hearing Info: Impaired Speech Info: Impaired    SPECIAL CARE FACTORS FREQUENCY                       Contractures Contractures Info: Not present    Additional Factors Info  Code Status Code Status Info: full             Current Medications (06/10/2018):  This is the current hospital active medication list Current Facility-Administered Medications  Medication Dose Route Frequency Provider Last Rate Last Dose  .  acetaminophen (TYLENOL) tablet 650 mg  650 mg Oral Q6H PRN Cammy Copa, MD       Or  . acetaminophen (TYLENOL) suppository 650 mg  650 mg Rectal Q6H PRN Cammy Copa, MD      . amLODipine (NORVASC) tablet 10 mg  10 mg Per Tube QHS Cammy Copa, MD   10 mg at 06/09/18 2144  . Ampicillin-Sulbactam (UNASYN) 3 g in sodium chloride 0.9 % 100 mL IVPB  3 g Intravenous Q6H Houston Siren, MD   Stopped at 06/10/18 1158  . bisacodyl (DULCOLAX) EC tablet 5 mg  5 mg Oral Daily PRN Cammy Copa, MD      . bisacodyl (DULCOLAX) suppository 10 mg  10 mg Rectal Daily PRN Cammy Copa, MD      . Melene Muller ON 06/12/2018] cloNIDine (CATAPRES - Dosed in mg/24 hr) patch 0.3 mg  0.3 mg Transdermal Q Thu-1800 Cammy Copa, MD      . docusate (COLACE) 50 MG/5ML liquid 100 mg  100 mg Oral BID Cammy Copa, MD   100 mg at 06/10/18 1400  . feeding supplement (GLUCERNA 1.5 CAL) liquid 1,000 mL  1,000 mL Per Tube Continuous Sainani, Rolly Pancake, MD      . free water 100 mL  100 mL Per Tube Q4H Sainani, Rolly Pancake, MD   100 mL  at 06/10/18 1400  . HYDROcodone-acetaminophen (NORCO/VICODIN) 5-325 MG per tablet 1-2 tablet  1-2 tablet Oral Q4H PRN Cammy CopaMaier, Angela, MD      . insulin aspart (novoLOG) injection 0-5 Units  0-5 Units Subcutaneous QHS Cammy CopaMaier, Angela, MD      . insulin aspart (novoLOG) injection 0-9 Units  0-9 Units Subcutaneous TID WC Cammy CopaMaier, Angela, MD   2 Units at 06/10/18 [phone]  . insulin glargine (LANTUS) injection 20 Units  20 Units Subcutaneous QHS Cammy CopaMaier, Angela, MD   20 Units at 06/09/18 2138  . lisinopril (PRINIVIL,ZESTRIL) tablet 40 mg  40 mg Oral Daily Cammy CopaMaier, Angela, MD      . magnesium oxide (MAG-OX) tablet 400 mg  400 mg Oral Daily Cammy CopaMaier, Angela, MD   400 mg at 06/10/18 1121  . metoprolol tartrate (LOPRESSOR) tablet 50 mg  50 mg Per Tube BID Cammy CopaMaier, Angela, MD   50 mg at 06/09/18 2144  . [START ON 06/11/2018] multivitamin liquid 15 mL  15 mL Per Tube Daily Houston SirenSainani, Vivek J, MD      . ondansetron [employer](ZOFRAN) tablet 4 mg   4 mg Oral Q6H PRN Cammy CopaMaier, Angela, MD       Or  . ondansetron [facility](ZOFRAN) injection 4 mg  4 mg Intravenous Q6H PRN Cammy CopaMaier, Angela, MD      . ondansetron [facility](ZOFRAN) tablet 4 mg  4 mg Per Tube Q6H PRN Cammy CopaMaier, Angela, MD      . pantoprazole sodium (PROTONIX) 40 mg/20 mL oral suspension 20 mg  20 mg Per Tube Daily Cammy CopaMaier, Angela, MD   20 mg at 06/10/18 1121  . polyethylene glycol (MIRALAX / GLYCOLAX) packet 17 g  17 g Per Tube QHS Cammy CopaMaier, Angela, MD   17 g at 06/09/18 2144  . Rivaroxaban (XARELTO) tablet 15 mg  15 mg Oral Q supper Cammy CopaMaier, Angela, MD   15 mg at 06/09/18 1846  . traZODone (DESYREL) tablet 25 mg  25 mg Oral QHS PRN Cammy CopaMaier, Angela, MD         Discharge Medications: Please Lisbeth discharge summary for a list of discharge medications.  Relevant Imaging Results:  Relevant Lab Results:   Additional Information    York SpanielMonica Kelsye Loomer, LCSW

## 2018-06-10 NOTE — Progress Notes (Addendum)
Nutrition Follow Up Note   DOCUMENTATION CODES:   Non-severe (moderate) malnutrition in context of chronic illness  INTERVENTION:   Initiate home tube feed regimen- Glucerna 1.5 continuous at 4685ml/hr x 14 hrs/day (0600-2000)   Free water 100ml q 4 hours  Regimen with free water flushes provides 1785 kcal, 98 grams protein, 19g/day fiber and 1503 mL free water daily.  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  GOAL:   Patient will meet greater than or equal to 90% of their needs  -progressing with tube feeds  MONITOR:   Labs, Weight trends, TF tolerance, Skin, I & O's  REASON FOR ASSESSMENT:   Consult Assessment of nutrition requirement/status, Enteral/tube feeding initiation and management  ASSESSMENT:   [age] year old female who presented from [facility] with hematemesis. Pt is nonverbal at baseline. 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 CT reported gastrostomy tube balloon to be in the proximal jejunum, with upstream distention of the duodenum and stomach.   Pt s/p gastrografin study through the PEG tube 7/8 which showed no obstruction. Tube feeds resumed; pt tolerating well. Spoke to RD at [facility], pt currently on tube feed regimen of Glucerna 1.5 @85ml /hr x 14 hrs (0600-2000). Per RD, pt has been vomiting regularly and has continued to loose a significant amount of weight over the past 10 months. Pt was initially started on bolus tube feeds at the facility but was changed to continuous feeds because of vomiting, continuous weight loss and complaints of hunger between feeds. RD reports that pt has been alert and  responsive lately and they are hoping to be able to get her out of bed some.   Medications reviewed and include: colace, insulin, Mg oxide, MVI, protonix, miralax, unasyn  Labs reviewed: Na 146(H), K 3.3(L), creat 0.34(L), Ca 8.3(L) cbgs- 169, 150, 98, 152, 180 x 24 hrs  Diet Order:   Diet Order           Diet NPO time specified  Diet effective now         EDUCATION NEEDS:   Not appropriate for education at this time  Skin:  Skin Assessment: Reviewed RN Assessment  Last BM:  7/8- type 6  Height:   Ht Readings from Last 1 Encounters:  06/08/18 4\' 11"  (1.499 m)    Weight:   Wt Readings from Last 1 Encounters:  06/09/18 114 lb 10.2 oz (52 kg)    Ideal Body Weight:  43.18 kg  BMI:  Body mass index is 23.15 kg/m.  Estimated Nutritional Needs:   Kcal:  1500-1700kcal/day  Protein:  83-93g/day   Fluid:  >1.5L/day   Betsey Holidayasey Kaveri Perras MS, RD, LDN Pager #- [phone] Office#- [phone] After Hours Pager: [phone]

## 2018-06-10 NOTE — Progress Notes (Signed)
[facility] [place] [facility]   PATIENT NAME: [name]    MR#:  [id number]  DATE OF BIRTH:  November 20, 1961  SUBJECTIVE:   No further nausea or vomiting overnight today.  Had a Gastrografin study through the PEG tube which was negative for obstruction.  Patient is tolerating her tube feeds well now.  Noted to be mildly hypernatremic today.  REVIEW OF SYSTEMS:    Review of Systems  Unable to perform ROS: Mental acuity     Nutrition: Tube feeds Tolerating Diet: Yes Tolerating PT: Bedbound  DRUG ALLERGIES:  No Known Allergies  VITALS:  Blood pressure 129/77, pulse (!) 56, temperature 97.8 F (36.6 C), temperature source Axillary, resp. rate 17, height 4\' 11"  (1.499 m), weight 52 kg (114 lb 10.2 oz), SpO2 100 %.  PHYSICAL EXAMINATION:   Physical Exam  GENERAL:  57 y.o.-year-old patient lying in bed in no acute distress.  EYES: Pupils equal, round, reactive to light and accommodation. No scleral icterus. Extraocular muscles intact.  HEENT: Head atraumatic, normocephalic. Oropharynx and nasopharynx clear.  NECK:  Supple, no jugular venous distention. No thyroid enlargement, no tenderness.  LUNGS: Normal breath sounds bilaterally, no wheezing, rales, rhonchi. No use of accessory muscles of respiration.  CARDIOVASCULAR: S1, S2 normal. No murmurs, rubs, or gallops.  ABDOMEN: Soft, nontender, nondistended. Bowel sounds present. No organomegaly or mass. + PEG tube in place.   EXTREMITIES: No cyanosis, clubbing or edema b/l.    NEUROLOGIC: Cranial nerves II through XII are intact. No focal Motor or sensory deficits b/l.  Dense left sided weakness from previous CVA. Non-verbal.  PSYCHIATRIC: The patient is alert and oriented x 1.  SKIN: No obvious rash, lesion, or ulcer.    LABORATORY PANEL:   CBC Recent Labs  Lab 06/10/18 0424  WBC 4.9  HGB 11.7*  HCT 34.5*  PLT 123*    ------------------------------------------------------------------------------------------------------------------  Chemistries  Recent Labs  Lab 06/08/18 1926  06/10/18 0424  NA 141   < > 146*  K 4.4   < > 3.3*  CL 99   < > 112*  CO2 31   < > 28  GLUCOSE 400*   < > 178*  BUN 54*   < > 15  CREATININE 0.80   < > 0.34*  CALCIUM 8.8*   < > 8.3*  AST 42*  --   --   ALT 39  --   --   ALKPHOS 64  --   --   BILITOT 1.1  --   --    < > = values in this interval not displayed.   ------------------------------------------------------------------------------------------------------------------  Cardiac Enzymes No results for input(s): TROPONINI in the last 168 hours. ------------------------------------------------------------------------------------------------------------------  RADIOLOGY:  Ct Chest W Contrast  Result Date: 06/08/2018 CLINICAL DATA:  57 y/o F; brown emesis, hypoxia, fever. Code sepsis. EXAM: CT CHEST, ABDOMEN, AND PELVIS WITH CONTRAST TECHNIQUE: Multidetector CT imaging of the chest, abdomen and pelvis was performed following the standard protocol during bolus administration of intravenous contrast. CONTRAST:  OMNIPAQUE IOHEXOL 300 MG/ML  SOLN COMPARISON:  06/28/2012 CT abdomen and pelvis.  06/05/2012 CT chest. FINDINGS: CT CHEST FINDINGS Cardiovascular: Normal heart size. No pericardial effusion. Aortic and severe coronary artery calcific atherosclerosis. Mediastinum/Nodes: No enlarged mediastinal, hilar, or axillary lymph nodes. Thyroid gland, trachea, and esophagus demonstrate no significant findings. Lungs/Pleura: Minor dependent ground-glass opacities in the lungs compatible with atelectasis. No pleural effusion or pneumothorax. No consolidation Musculoskeletal: No acute fracture. CT ABDOMEN PELVIS FINDINGS  Hepatobiliary: No focal liver abnormality is seen. No gallstones, gallbladder wall thickening, or biliary dilatation. Pancreas: Unremarkable. No pancreatic  ductal dilatation or surrounding inflammatory changes. Spleen: Normal in size without focal abnormality. Adrenals/Urinary Tract: Normal adrenal glands. No focal kidney lesion. Multiple punctate nonobstructing stones in the right kidney. No hydronephrosis. Mild bladder distention. Stomach/Bowel: Percutaneous gastrostomy catheter with the balloon expanded in the proximal jejunum. Upstream distention of the stomach and duodenum. Large volume of stool in the colon and rectum compatible with constipation. Normal appendix. Vascular/Lymphatic: Aortic atherosclerosis. No enlarged abdominal or pelvic lymph nodes. IVC filter in situ. Reproductive: Uterus and bilateral adnexa are unremarkable. Other: No abdominal wall hernia or abnormality. No abdominopelvic ascites. Musculoskeletal: No fracture is seen. IMPRESSION: 1. The gastrostomy tube is advanced into the proximal jejunum where the balloon is expanded. Upstream the duodenum and stomach are distended with fluid indicating obstruction. Additionally, there is wall thickening of the distal duodenum probably due to compression from the gastrostomy catheter and/or associated inflammation. 2. Right kidney nonobstructive punctate nephrolithiasis. 3. Moderate aortic and severe coronary artery calcific atherosclerosis. 4. Large volume of stool in the distal colon and rectum, probably constipation or chronic dysmotility. These results were called by telephone at the time of interpretation on 06/08/2018 at 9:50 pm to Dr. Sharman CheekPHILLIP STAFFORD , who verbally acknowledged these results. Electronically Signed   By: Mitzi HansenLance  Furusawa-Stratton M.D.   On: 06/08/2018 21:55   Ct Abdomen Pelvis W Contrast  Result Date: 06/08/2018 CLINICAL DATA:  57 y/o F; brown emesis, hypoxia, fever. Code sepsis. EXAM: CT CHEST, ABDOMEN, AND PELVIS WITH CONTRAST TECHNIQUE: Multidetector CT imaging of the chest, abdomen and pelvis was performed following the standard protocol during bolus administration of  intravenous contrast. CONTRAST:  100mL OMNIPAQUE IOHEXOL 300 MG/ML  SOLN COMPARISON:  06/28/2012 CT abdomen and pelvis.  06/05/2012 CT chest. FINDINGS: CT CHEST FINDINGS Cardiovascular: Normal heart size. No pericardial effusion. Aortic and severe coronary artery calcific atherosclerosis. Mediastinum/Nodes: No enlarged mediastinal, hilar, or axillary lymph nodes. Thyroid gland, trachea, and esophagus demonstrate no significant findings. Lungs/Pleura: Minor dependent ground-glass opacities in the lungs compatible with atelectasis. No pleural effusion or pneumothorax. No consolidation Musculoskeletal: No acute fracture. CT ABDOMEN PELVIS FINDINGS Hepatobiliary: No focal liver abnormality is seen. No gallstones, gallbladder wall thickening, or biliary dilatation. Pancreas: Unremarkable. No pancreatic ductal dilatation or surrounding inflammatory changes. Spleen: Normal in size without focal abnormality. Adrenals/Urinary Tract: Normal adrenal glands. No focal kidney lesion. Multiple punctate nonobstructing stones in the right kidney. No hydronephrosis. Mild bladder distention. Stomach/Bowel: Percutaneous gastrostomy catheter with the balloon expanded in the proximal jejunum. Upstream distention of the stomach and duodenum. Large volume of stool in the colon and rectum compatible with constipation. Normal appendix. Vascular/Lymphatic: Aortic atherosclerosis. No enlarged abdominal or pelvic lymph nodes. IVC filter in situ. Reproductive: Uterus and bilateral adnexa are unremarkable. Other: No abdominal wall hernia or abnormality. No abdominopelvic ascites. Musculoskeletal: No fracture is seen. IMPRESSION: 1. The gastrostomy tube is advanced into the proximal jejunum where the balloon is expanded. Upstream the duodenum and stomach are distended with fluid indicating obstruction. Additionally, there is wall thickening of the distal duodenum probably due to compression from the gastrostomy catheter and/or associated  inflammation. 2. Right kidney nonobstructive punctate nephrolithiasis. 3. Moderate aortic and severe coronary artery calcific atherosclerosis. 4. Large volume of stool in the distal colon and rectum, probably constipation or chronic dysmotility. These results were called by telephone at the time of interpretation on 06/08/2018 at 9:50 pm to Dr. Sharman CheekPHILLIP STAFFORD ,  who verbally acknowledged these results. Electronically Signed   By: Mitzi Hansen M.D.   On: 06/08/2018 21:55   Dg Abdomen Peg Tube Location  Result Date: 06/09/2018 CLINICAL DATA:  Vomiting after PEG tube placement. EXAM: ABDOMEN - 1 VIEW COMPARISON:  CT abdomen and pelvis 06/08/2018. FINDINGS: The stomach has been filled with contrast. There is no evidence of extraluminal contrast leakage. The stomach appears well distended. IVC filter appears suprarenal. Radiodense sponge overlies the lower abdomen. Moderate stool burden. IMPRESSION: No adverse features related to PEG placement or repositioning. Electronically Signed   By: Elsie Stain M.D.   On: 06/09/2018 13:03   Dg Chest Port 1 View  Result Date: 06/08/2018 CLINICAL DATA:  Manson Passey emesis EXAM: PORTABLE CHEST 1 VIEW COMPARISON:  06/19/2012 FINDINGS: Artifact from EKG leads. An IVC filter is present. Low volume chest with interstitial crowding. There is no edema, consolidation, effusion, or pneumothorax. Prominent asymmetric left shoulder girdle osteopenia. Patient has history of hemiplegia. IMPRESSION: Limited low volume chest without definite or focal abnormality. Electronically Signed   By: Marnee Spring M.D.   On: 06/08/2018 20:39     ASSESSMENT AND PLAN:   57 year old female with past medical history of previous CVA resulting in dense right-sided weakness, GERD, diabetes, chronic aphasia, vertigo presented to the hospital due to intractable nausea vomiting and thought to have a complication/obstruction of the PEG tube.  1.  Nausea/vomiting-suspected to be secondary to a PEG  tube complication.  This was based off an abnormal CT scan on admission.  Patient underwent a Gastrografin study through the PEG tube which shows no obstruction.  Tube feedings have been resumed and patient is tolerating them without any further nausea vomiting. - Continue supportive care.  2.  Sepsis- patient presented to the hospital with a fever, leukocytosis and nausea vomiting and suspected to have a aspiration pneumonia.   -Continue IV Unasyn, currently afebrile and hemodynamically stable.  Can switch to oral Augmentin upon discharge.  3.  Aspiration pneumonia- source of patient's sepsis.  Continue IV Unasyn, follow cultures.  Tube feedings can now be resumed as no obstruction of the PEG tube based on Gastrografin study.  4. Hypernatremia/Hypokalemia - will cont. Free water flushes with tube feeding and cont. Potassium supplementation and repeat level in a.m.  - d/c NS for now.   5.  Diabetes type 2 without complication-continue Lantus, NovoLog sliding scale.  Blood sugar stable.  5.  Essential hypertension-continue clonidine patch, Norvasc, Metoprolol  6.  Chronic atrial fibrillation-rate controlled.  Continue metoprolol, continue Xarelto.   Likely discharge to SNF tomorrow on Oral abx and tube feedings if electrolytes stable.   All the records are reviewed and case discussed with Care Management/Social Worker. Management plans discussed with the patient, family and they are in agreement.  CODE STATUS: Full code  DVT Prophylaxis: Xarelto  TOTAL TIME TAKING CARE OF THIS PATIENT: 30 minutes.   POSSIBLE D/C IN 1-2 DAYS, DEPENDING ON CLINICAL CONDITION.   Houston Siren M.D on 06/10/2018 at 1:53 PM  Between 7am to 6pm - Pager - [phone]  After 6pm go to [email or web address] - Social research officer, government  [facility] [place] [facility]  Office  [phone]  CC: Primary care physician; Derwood Kaplan, MD

## 2018-06-11 LAB — BASIC METABOLIC PANEL
Anion gap: 5 (ref 5–15)
BUN: 16 mg/dL (ref 6–20)
CHLORIDE: 108 mmol/L (ref 98–111)
CO2: 27 mmol/L (ref 22–32)
CREATININE: 0.34 mg/dL — AB (ref 0.44–1.00)
Calcium: 8.5 mg/dL — ABNORMAL LOW (ref 8.9–10.3)
Glucose, Bld: 227 mg/dL — ABNORMAL HIGH (ref 70–99)
Potassium: 4 mmol/L (ref 3.5–5.1)
SODIUM: 140 mmol/L (ref 135–145)

## 2018-06-11 LAB — GLUCOSE, CAPILLARY
GLUCOSE-CAPILLARY: 220 mg/dL — AB (ref 70–99)
Glucose-Capillary: 269 mg/dL — ABNORMAL HIGH (ref 70–99)

## 2018-06-11 MED ORDER — DOCUSATE SODIUM 50 MG/5ML PO LIQD: 100.0000 mg | Freq: Two times a day (BID) | ORAL | 0 refills | Status: AC

## 2018-06-11 MED ORDER — FREE WATER: 100.0000 mL | Status: AC

## 2018-06-11 MED ORDER — TRAZODONE HCL 50 MG PO TABS: 25.0000 mg | ORAL_TABLET | Freq: Every evening | ORAL | Status: AC | PRN

## 2018-06-11 MED ORDER — AMOXICILLIN-POT CLAVULANATE 600-42.9 MG/5ML PO SUSR: 600.0000 mg | Freq: Two times a day (BID) | ORAL | 0 refills | Status: AC

## 2018-06-11 MED ORDER — HYDROCODONE-ACETAMINOPHEN 5-325 MG PO TABS: 1.0000 | ORAL_TABLET | ORAL | 0 refills | Status: AC | PRN

## 2018-06-11 NOTE — Progress Notes (Signed)
Inpatient Diabetes Program Recommendations  AACE/ADA: New Consensus Statement on Inpatient Glycemic Control (2015)  Target Ranges:  Prepandial:   less than 140 mg/dL      Peak postprandial:   less than 180 mg/dL (1-2 hours)      Critically ill patients:  140 - 180 mg/dL   Lab Results  Component Value Date   GLUCAP 269 (H) 06/11/2018   HGBA1C 7.1 (H) 08/27/2017    Review of Glycemic ControlResults for Bascomb, Takako (MRN [id number]) as of 06/11/2018 08:36  Ref. Range 06/10/2018 07:42 06/10/2018 11:50 06/10/2018 16:46 06/10/2018 21:06 06/11/2018 07:49  Glucose-Capillary Latest Ref Range: 70 - 99 mg/dL 696180 (H) 295108 (H) 284216 (H) 220 (H) 269 (H)    Diabetes history: Type 2 DM Outpatient Diabetes medications:  Novolog 201-250 mg/dL- 2 units, 132-440251-300 mg/dL-4 units, 102-725301-350 mg/dL-6 units, 366-440351-400 mg/dL-8 units, 347-425401-450 ZD/GL-87mg/dL-10 units/Lantus 20 units q HS Current orders for Inpatient glycemic control:  Novolog sensitive tid with meals and HS, Lantus 20 units q HS  Inpatient Diabetes Program Recommendations:    Note that patient is on Glucerna tube feeds from 0600-2000- Please consider increasing Novolog correction to q 6 hours.    Thanks,  Beryl MeagerJenny Emillia Weatherly, RN, BC-ADM Inpatient Diabetes Coordinator Pager [phone] (8a-5p)

## 2018-06-11 NOTE — Discharge Summary (Signed)
[facility] - [place] [facility]  Kasiyah [place] North[place], 57 y.o., DOB 1961-07-05, MRN [id number]. Admission date: 06/08/2018 Discharge Date 06/11/2018 Primary MD Derwood KaplanEason, Ernest B, MD Admitting Physician Cammy CopaAngela Maier, MD  Admission Diagnosis  PEG tube malfunction [facility](HCC) [K94.23] Sepsis, due to unspecified organism [facility](HCC) [A41.9] Aspiration pneumonia, unspecified aspiration pneumonia type, unspecified laterality, unspecified part of lung (HCC) [J69.0]  Discharge Diagnosis   Active Problems: Nausea vomiting secondary to PEG tube complication Sepsis due to aspiration pneumonia Hypernatremia Hypokalemia Diabetes type 2 Essential hypertension Chronic atrial fibrillation History of previous CVA with chronic aphasia and hemiplegia   Hospital Course [name] ClusterVannasaeng  is a 57 y.o. 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 Patient was sent to the emergency room due to intractable nausea vomiting lethargy and shortness of breath for the past 2 days.  Patient in the emergency room was noted to have hypoxia tachycardia findings consistent with sepsis.  He was noted to have aspiration pneumonia.  Patient was thought to have complications with her PEG tube was seen by GI underwent Gastrografin study.  Now her nausea vomiting is resolved.  She is tolerating her tube feeds.  She is stable to return back to the facility.    Patient to resume tube feeds as previously Free water 100 cc every 4 hours while awake Augmentin for 5 days      Consults  GI  Significant Tests:  Jannifer full reports for all details     Ct Chest W Contrast  Result Date: 06/08/2018 CLINICAL DATA:  57 y/o F; brown emesis, hypoxia, fever. Code sepsis. EXAM: CT CHEST, ABDOMEN, AND PELVIS WITH CONTRAST TECHNIQUE: Multidetector CT imaging of the chest, abdomen and pelvis was performed following the standard protocol during bolus  administration of intravenous contrast. CONTRAST:  100mL OMNIPAQUE IOHEXOL 300 MG/ML  SOLN COMPARISON:  06/28/2012 CT abdomen and pelvis.  06/05/2012 CT chest. FINDINGS: CT CHEST FINDINGS Cardiovascular: Normal heart size. No pericardial effusion. Aortic and severe coronary artery calcific atherosclerosis. Mediastinum/Nodes: No enlarged mediastinal, hilar, or axillary lymph nodes. Thyroid gland, trachea, and esophagus demonstrate no significant findings. Lungs/Pleura: Minor dependent ground-glass opacities in the lungs compatible with atelectasis. No pleural effusion or pneumothorax. No consolidation Musculoskeletal: No acute fracture. CT ABDOMEN PELVIS FINDINGS Hepatobiliary: No focal liver abnormality is seen. No gallstones, gallbladder wall thickening, or biliary dilatation. Pancreas: Unremarkable. No pancreatic ductal dilatation or surrounding inflammatory changes. Spleen: Normal in size without focal abnormality. Adrenals/Urinary Tract: Normal adrenal glands. No focal kidney lesion. Multiple punctate nonobstructing stones in the right kidney. No hydronephrosis. Mild bladder distention. Stomach/Bowel: Percutaneous gastrostomy catheter with the balloon expanded in the proximal jejunum. Upstream distention of the stomach and duodenum. Large volume of stool in the colon and rectum compatible with constipation. Normal appendix. Vascular/Lymphatic: Aortic atherosclerosis. No enlarged abdominal or pelvic lymph nodes. IVC filter in situ. Reproductive: Uterus and bilateral adnexa are unremarkable. Other: No abdominal wall hernia or abnormality. No abdominopelvic ascites. Musculoskeletal: No fracture is seen. IMPRESSION: 1. The gastrostomy tube is advanced into the proximal jejunum where the balloon is expanded. Upstream the duodenum and stomach are distended with fluid indicating obstruction. Additionally, there is wall thickening of the distal duodenum probably due to compression from the gastrostomy catheter and/or  associated inflammation. 2. Right kidney nonobstructive punctate nephrolithiasis. 3. Moderate aortic and severe coronary artery calcific atherosclerosis. 4. Large volume of stool in the distal colon and rectum, probably  constipation or chronic dysmotility. These results were called by telephone at the time of interpretation on 06/08/2018 at 9:50 pm to Dr. Sharman Cheek , who verbally acknowledged these results. Electronically Signed   By: Mitzi Hansen M.D.   On: 06/08/2018 21:55   Ct Abdomen Pelvis W Contrast  Result Date: 06/08/2018 CLINICAL DATA:  57 y/o F; brown emesis, hypoxia, fever. Code sepsis. EXAM: CT CHEST, ABDOMEN, AND PELVIS WITH CONTRAST TECHNIQUE: Multidetector CT imaging of the chest, abdomen and pelvis was performed following the standard protocol during bolus administration of intravenous contrast. CONTRAST:  OMNIPAQUE IOHEXOL 300 MG/ML  SOLN COMPARISON:  06/28/2012 CT abdomen and pelvis.  06/05/2012 CT chest. FINDINGS: CT CHEST FINDINGS Cardiovascular: Normal heart size. No pericardial effusion. Aortic and severe coronary artery calcific atherosclerosis. Mediastinum/Nodes: No enlarged mediastinal, hilar, or axillary lymph nodes. Thyroid gland, trachea, and esophagus demonstrate no significant findings. Lungs/Pleura: Minor dependent ground-glass opacities in the lungs compatible with atelectasis. No pleural effusion or pneumothorax. No consolidation Musculoskeletal: No acute fracture. CT ABDOMEN PELVIS FINDINGS Hepatobiliary: No focal liver abnormality is seen. No gallstones, gallbladder wall thickening, or biliary dilatation. Pancreas: Unremarkable. No pancreatic ductal dilatation or surrounding inflammatory changes. Spleen: Normal in size without focal abnormality. Adrenals/Urinary Tract: Normal adrenal glands. No focal kidney lesion. Multiple punctate nonobstructing stones in the right kidney. No hydronephrosis. Mild bladder distention. Stomach/Bowel: Percutaneous  gastrostomy catheter with the balloon expanded in the proximal jejunum. Upstream distention of the stomach and duodenum. Large volume of stool in the colon and rectum compatible with constipation. Normal appendix. Vascular/Lymphatic: Aortic atherosclerosis. No enlarged abdominal or pelvic lymph nodes. IVC filter in situ. Reproductive: Uterus and bilateral adnexa are unremarkable. Other: No abdominal wall hernia or abnormality. No abdominopelvic ascites. Musculoskeletal: No fracture is seen. IMPRESSION: 1. The gastrostomy tube is advanced into the proximal jejunum where the balloon is expanded. Upstream the duodenum and stomach are distended with fluid indicating obstruction. Additionally, there is wall thickening of the distal duodenum probably due to compression from the gastrostomy catheter and/or associated inflammation. 2. Right kidney nonobstructive punctate nephrolithiasis. 3. Moderate aortic and severe coronary artery calcific atherosclerosis. 4. Large volume of stool in the distal colon and rectum, probably constipation or chronic dysmotility. These results were called by telephone at the time of interpretation on 06/08/2018 at 9:50 pm to Dr. Sharman Cheek , who verbally acknowledged these results. Electronically Signed   By: Mitzi Hansen M.D.   On: 06/08/2018 21:55   Dg Abdomen Peg Tube Location  Result Date: 06/09/2018 CLINICAL DATA:  Vomiting after PEG tube placement. EXAM: ABDOMEN - 1 VIEW COMPARISON:  CT abdomen and pelvis 06/08/2018. FINDINGS: The stomach has been filled with contrast. There is no evidence of extraluminal contrast leakage. The stomach appears well distended. IVC filter appears suprarenal. Radiodense sponge overlies the lower abdomen. Moderate stool burden. IMPRESSION: No adverse features related to PEG placement or repositioning. Electronically Signed   By: Elsie Stain M.D.   On: 06/09/2018 13:03   Dg Chest Port 1 View  Result Date: 06/08/2018 CLINICAL DATA:  Manson Passey  emesis EXAM: PORTABLE CHEST 1 VIEW COMPARISON:  06/19/2012 FINDINGS: Artifact from EKG leads. An IVC filter is present. Low volume chest with interstitial crowding. There is no edema, consolidation, effusion, or pneumothorax. Prominent asymmetric left shoulder girdle osteopenia. Patient has history of hemiplegia. IMPRESSION: Limited low volume chest without definite or focal abnormality. Electronically Signed   By: Marnee Spring M.D.   On: 06/08/2018 20:39  Today   Subjective:   Corah Carboni patient unable to provide chronic aphasia Objective:   Blood pressure 140/88, pulse 81, temperature 98.4 F (36.9 C), temperature source Axillary, resp. rate 18, height 4\' 11"  (1.499 m), weight 52 kg (114 lb 10.2 oz), SpO2 100 %.  .  Intake/Output Summary (Last 24 hours) at 06/11/2018 0944 Last data filed at 06/11/2018 0500 Gross per 24 hour  Intake 0 ml  Output 1700 ml  Net -1700 ml    Exam VITAL SIGNS: Blood pressure 140/88, pulse 81, temperature 98.4 F (36.9 C), temperature source Axillary, resp. rate 18, height 4\' 11"  (1.499 m), weight 52 kg (114 lb 10.2 oz), SpO2 100 %.  GENERAL:  57 y.o.-year-old patient lying in the bed with no acute distress.  EYES: Pupils equal, round, reactive to light and accommodation. No scleral icterus. Extraocular muscles intact.  HEENT: Head atraumatic, normocephalic. Oropharynx and nasopharynx clear.  NECK:  Supple, no jugular venous distention. No thyroid enlargement, no tenderness.  LUNGS: Normal breath sounds bilaterally, no wheezing, rales,rhonchi or crepitation. No use of accessory muscles of respiration.  CARDIOVASCULAR: S1, S2 normal. No murmurs, rubs, or gallops.  ABDOMEN: Soft, nontender, nondistended. Bowel sounds present. No organomegaly or mass.  EXTREMITIES: No pedal edema, cyanosis, or clubbing.  NEUROLOGIC: Limited exam unable to follow commands PSYCHIATRIC: The patient is alert and oriented x 3.  SKIN: No obvious rash, lesion, or  ulcer.   Data Review     CBC w Diff:  Lab Results  Component Value Date   WBC 4.9 06/10/2018   HGB 11.7 (L) 06/10/2018   HGB 13.6 07/01/2012   HCT 34.5 (L) 06/10/2018   HCT 40.7 07/01/2012   PLT 123 (L) 06/10/2018   PLT 844 (H) 07/01/2012   LYMPHOPCT 5 06/08/2018   LYMPHOPCT 21.7 07/01/2012   MONOPCT 3 06/08/2018   MONOPCT 7.2 07/01/2012   EOSPCT 0 06/08/2018   EOSPCT 1.6 07/01/2012   BASOPCT 0 06/08/2018   BASOPCT 0.5 07/01/2012   CMP:  Lab Results  Component Value Date   NA 140 06/11/2018   NA 136 06/28/2012   K 4.0 06/11/2018   K 4.3 06/28/2012   CL 108 06/11/2018   CL 100 06/28/2012   CO2 27 06/11/2018   CO2 26 06/28/2012   BUN 16 06/11/2018   BUN 20 (H) 06/28/2012   CREATININE 0.34 (L) 06/11/2018   CREATININE 1.01 06/28/2012   PROT 8.0 06/08/2018   PROT 9.5 (H) 06/05/2012   ALBUMIN 3.3 (L) 06/08/2018   ALBUMIN 2.5 (L) 06/17/2012   BILITOT 1.1 06/08/2018   BILITOT 0.8 06/05/2012   ALKPHOS 64 06/08/2018   ALKPHOS 78 06/05/2012   AST 42 (H) 06/08/2018   AST 28 06/05/2012   ALT 39 06/08/2018   ALT 28 06/05/2012  .  Micro Results Recent Results (from the past 240 hour(s))  Culture, blood (Routine x 2)     Status: None (Preliminary result)   Collection Time: 06/08/18  7:28 PM  Result Value Ref Range Status   Specimen Description BLOOD RIGHT FOREARM  Final   Special Requests   Final    BOTTLES DRAWN AEROBIC AND ANAEROBIC Blood Culture adequate volume   Culture   Final    NO GROWTH 2 DAYS Performed at [facility], [address] [place], [place] [address]    Report Status PENDING  Incomplete  Culture, blood (Routine x 2)     Status: None (Preliminary result)   Collection Time: 06/08/18  7:28 PM  Result Value Ref Range Status   Specimen Description BLOOD RIGHT WRIST  Final   Special Requests   Final    BOTTLES DRAWN AEROBIC AND ANAEROBIC Blood Culture adequate volume   Culture   Final    NO GROWTH 2 DAYS Performed at [facility], [address], [place], [place] [address]    Report Status PENDING  Incomplete  Urine Culture     Status: None   Collection Time: 06/08/18  7:28 PM  Result Value Ref Range Status   Specimen Description   Final    URINE, CATHETERIZED Performed at [facility], [address] [place], [place] [address]    Special Requests   Final    NONE Performed at [facility], [address], [place], [place] [address]    Culture   Final    NO GROWTH Performed at [facility], [address], [place], [place] [address]    Report Status 06/10/2018 FINAL  Final  MRSA PCR Screening     Status: None   Collection Time: 06/09/18  1:40 AM  Result Value Ref Range Status   MRSA by PCR NEGATIVE NEGATIVE Final    Comment:        The GeneXpert MRSA Assay (FDA approved for NASAL specimens only), is one component of a comprehensive MRSA colonization surveillance program. It is not intended to diagnose MRSA infection nor to guide or monitor treatment for MRSA infections. Performed at [facility], [address] [place], [place] [address]         Code Status Orders  (From admission, onward)        Start     Ordered   06/09/18 0056  Full code  Continuous     06/09/18 0055    Code Status History    Date Active Date Inactive Code Status Order ID Comments User Context   08/27/2017 1723 08/29/2017 2030 Full Code [id number]  Schnier, Latina Craver, MD Inpatient          Contact information for after-discharge care    Destination    [facility] .   Service:  Skilled Nursing Contact information: [address] [place] [address] [phone]              Discharge Medications   Allergies as of 06/11/2018   No Known Allergies     Medication List    STOP taking these medications   lisinopril 40 MG tablet Commonly known as:  PRINIVIL,ZESTRIL     TAKE these medications   amLODipine 10 MG  tablet Commonly known as:  NORVASC Place 10 mg into feeding tube at bedtime.   amoxicillin-clavulanate 600-42.9 MG/5ML suspension Commonly known as:  AUGMENTIN ES-600 Take 5 mLs (600 mg total) by mouth 2 (two) times daily for 5 days.   BIOFREEZE 4 % Gel Generic drug:  Menthol (Topical Analgesic) Apply 1 application topically every 8 (eight) hours as needed. Apply to left arm, shoulder, and elbow   bisacodyl 10 MG suppository Commonly known as:  DULCOLAX Place 10 mg rectally daily as needed for moderate constipation.   cloNIDine 0.3 mg/24hr patch Commonly known as:  CATAPRES - Dosed in mg/24 hr Place 0.3 mg onto the skin every Thursday at 6pm.   docusate 50 MG/5ML liquid Commonly known as:  COLACE Take 10 mLs (100 mg total) by mouth 2 (two) times daily.   free water Soln Place 100 mLs into feeding tube every 4 (  four) hours.   HYDROcodone-acetaminophen 5-325 MG tablet Commonly known as:  NORCO/VICODIN Take 1-2 tablets by mouth every 4 (four) hours as needed for moderate pain.   insulin aspart 100 UNIT/ML injection Commonly known as:  novoLOG Inject three times daily with meals and at bedtime per sliding scale:  0-200: 0u 201-250: 2u 251-300: 4u 301-350: 6u 351-400: 8u 401-450: 10u +450: Contact MD   insulin glargine 100 UNIT/ML injection Commonly known as:  LANTUS Inject 20 Units into the skin at bedtime.   magnesium oxide 400 MG tablet Commonly known as:  MAG-OX Take 400 mg by mouth daily.   metoprolol tartrate 50 MG tablet Commonly known as:  LOPRESSOR Place 50 mg into feeding tube 2 (two) times daily.   multivitamin Liqd Place 15 mLs into feeding tube daily.   omeprazole 2 mg/mL Susp Commonly known as:  PRILOSEC Place 10 mg into feeding tube daily.   ondansetron 4 MG tablet Commonly known as:  ZOFRAN Place 4 mg into feeding tube every 6 (six) hours as needed for nausea or vomiting.   polyethylene glycol packet Commonly known as:  MIRALAX /  GLYCOLAX Place 17 g into feeding tube at bedtime.   Rivaroxaban 15 MG Tabs tablet Commonly known as:  XARELTO Take 15 mg by mouth daily.   traZODone 50 MG tablet Commonly known as:  DESYREL Take 0.5 tablets (25 mg total) by mouth at bedtime as needed for sleep.          Total Time in preparing paper work, data evaluation and todays exam - 35 minutes  Auburn Bilberry M.D on 06/11/2018 at 9:44 AM [employer] [facility]   Office  [phone]

## 2018-06-11 NOTE — Clinical Social Work Note (Signed)
Clinical Social Work Assessment  Patient Details  Name: [name] MRN: [id number] Date of Birth: 04-Jun-1961  Date of referral:  06/11/18               Reason for consult:  Discharge Planning                Permission sought to share information with:    Permission granted to share information::     Name::        Agency::     Relationship::     Contact Information:     Housing/Transportation Living arrangements for the past 2 months:  Skilled Nursing Facility Source of Information:  Adult Children Patient Interpreter Needed:  None Criminal Activity/Legal Involvement Pertinent to Current Situation/Hospitalization:  No - Comment as needed Significant Relationships:  Adult Children, Spouse Lives with:  Facility Resident Do you feel safe going back to the place where you live?  Yes Need for family participation in patient care:  Yes (Comment)  Care giving concerns:  Patient is a long term resident at [facility].   Social Worker assessment / plan:  Physician is discharging patient today to return to [facility]. Patient's son, [name], was contacted again by CSW and had to leave message. [name] called CSW back a few minutes later and he stated he was not aware his mom had come back to the hospital for PEG complications. CSW informed him that discharge back to [facility] was today and verbalized agreement and requested EMS transport.  Employment status:  Disabled (Comment on whether or not currently receiving Disability) Insurance information:  [place] Recommendations:    Information / Referral to community resources:     Patient/Family's Response to care:  Patient's son expressed appreciation for CSW assistance.  Patient/Family's Understanding of and Emotional Response to Diagnosis, Current Treatment, and Prognosis:  Patient's son had been unaware of his mother's readmit but was more at ease once CSW explained what she was admitted for and that she was ready  for discharge.   Emotional Assessment Appearance:    Attitude/Demeanor/Rapport:    Affect (typically observed):    Orientation:    Alcohol / Substance use:  Not Applicable Psych involvement (Current and /or in the community):  No (Comment)  Discharge Needs  Concerns to be addressed:  Care Coordination Readmission within the last 30 days:  Yes Current discharge risk:  None Barriers to Discharge:  No Barriers Identified   [name], LCSW 06/11/2018, 10:28 AM

## 2018-06-13 LAB — CULTURE, BLOOD (ROUTINE X 2)
CULTURE: NO GROWTH
Culture: NO GROWTH
Special Requests: ADEQUATE
Special Requests: ADEQUATE

## 2018-07-18 ENCOUNTER — Emergency Department
Admission: EM | Admit: 2018-07-18 | Discharge: 2018-08-03 | Disposition: E | Payer: Medicaid Other | Attending: Emergency Medicine | Admitting: Emergency Medicine

## 2018-07-18 ENCOUNTER — Emergency Department: Payer: Medicaid Other

## 2018-07-18 DIAGNOSIS — Z794 Long term (current) use of insulin: Secondary | ICD-10-CM | POA: Insufficient documentation

## 2018-07-18 DIAGNOSIS — Z7901 Long term (current) use of anticoagulants: Secondary | ICD-10-CM | POA: Insufficient documentation

## 2018-07-18 DIAGNOSIS — I10 Essential (primary) hypertension: Secondary | ICD-10-CM | POA: Diagnosis not present

## 2018-07-18 DIAGNOSIS — R6521 Severe sepsis with septic shock: Secondary | ICD-10-CM | POA: Insufficient documentation

## 2018-07-18 DIAGNOSIS — Z8673 Personal history of transient ischemic attack (TIA), and cerebral infarction without residual deficits: Secondary | ICD-10-CM | POA: Diagnosis not present

## 2018-07-18 DIAGNOSIS — A419 Sepsis, unspecified organism: Secondary | ICD-10-CM | POA: Diagnosis not present

## 2018-07-18 DIAGNOSIS — E119 Type 2 diabetes mellitus without complications: Secondary | ICD-10-CM | POA: Insufficient documentation

## 2018-07-18 DIAGNOSIS — R4182 Altered mental status, unspecified: Secondary | ICD-10-CM | POA: Diagnosis present

## 2018-07-18 DIAGNOSIS — Z79899 Other long term (current) drug therapy: Secondary | ICD-10-CM | POA: Insufficient documentation

## 2018-07-18 MED ORDER — SODIUM CHLORIDE 0.9 % IV BOLUS (SEPSIS): 250.0000 mL | Freq: Once | INTRAVENOUS | Status: DC

## 2018-07-18 MED ORDER — EPINEPHRINE PF 1 MG/10ML IJ SOSY
PREFILLED_SYRINGE | INTRAMUSCULAR | Status: AC | PRN
  Administered 2018-07-18 (×2): 10 mL via INTRAVENOUS

## 2018-07-18 MED ORDER — SODIUM CHLORIDE 0.9 % IV BOLUS (SEPSIS)
1000.0000 mL | Freq: Once | INTRAVENOUS | Status: AC
  Administered 2018-07-18: 1000 mL via INTRAVENOUS

## 2018-07-18 MED ORDER — VANCOMYCIN HCL IN DEXTROSE 1-5 GM/200ML-% IV SOLN: 1000.0000 mg | Freq: Once | INTRAVENOUS | Status: DC

## 2018-07-18 MED ORDER — SODIUM CHLORIDE 0.9 % IV SOLN: 2.0000 g | Freq: Once | INTRAVENOUS | Status: DC

## 2018-07-18 MED ORDER — SODIUM CHLORIDE 0.9 % IV BOLUS (SEPSIS): 500.0000 mL | Freq: Once | INTRAVENOUS | Status: DC

## 2018-07-18 MED ORDER — METRONIDAZOLE IN NACL 5-0.79 MG/ML-% IV SOLN
500.0000 mg | Freq: Three times a day (TID) | INTRAVENOUS | Status: DC
  Filled 2018-07-18 (×3): qty 100

## 2018-07-19 MED FILL — Medication: Qty: 1 | Status: AC

## 2018-08-03 NOTE — ED Notes (Signed)
RT called to patient room for CPR in progress. RT took over bagging patient on 100% BVM. Patient was then intubated by MD.  Size 7.5 ETT measured at 23 cm at lips. RT resumed bagging and CPR was continued until withdraw of care ordered by ED MD.

## 2018-08-03 NOTE — Code Documentation (Signed)
Pulse check. Pulses present. CPR held at this time.

## 2018-08-03 NOTE — Progress Notes (Signed)
Chaplain received page for death of patient. Nurse informed Chaplain that the son of decease was in route to hospital. Chaplain went to ED son had not arrive. Chaplain waited for son for two hours and nurse informed Chaplain she could give number and they will call when he arrived.

## 2018-08-03 NOTE — Progress Notes (Signed)
CODE SEPSIS - PHARMACY COMMUNICATION  **Broad Spectrum Antibiotics should be administered within 1 hour of Sepsis diagnosis**  Time Code Sepsis Called/Page Received: 0920  Antibiotics Ordered: Cefepime/Metronidazole/Vancomcyin - Patient expired  Time of 1st antibiotic administration: N/A  Additional action taken by pharmacy: N/A  If necessary, Name of Provider/Nurse Contacted: N/A    Simpson,Michael L ,PharmD Clinical Pharmacist  Sep 01, 2018  9:24 AM

## 2018-08-03 NOTE — ED Notes (Signed)
[facility] informed of pts death.

## 2018-08-03 NOTE — ED Notes (Signed)
Patient's son left facility and signed funeral release form.  AC given release form at this time.  Patient's son is Maggie FontDon Inthavong and can be reached at [phone]. Family of patient would [facility] in [place], [place].

## 2018-08-03 NOTE — Code Documentation (Signed)
Pulse check. PEA but organized activity. CPR continued.

## 2018-08-03 NOTE — ED Notes (Signed)
CPR started at this time.

## 2018-08-03 NOTE — Code Documentation (Signed)
Pulse check. PEA on the monitor. CPR continued.

## 2018-08-03 NOTE — Progress Notes (Signed)
Chaplain was page when family arrived. Chaplain went to meet family and we waited for the father of the son to come before going to [place] patient. Chaplain took family back and informed the nurse the family was there. Nurse sent doctor and doctor explain to family what happened. Chaplain stayed with family to give emotional support until family left.

## 2018-08-03 NOTE — ED Triage Notes (Signed)
From [facility]. Baseline is alert and talking. EMS vitals as follows: 98/39 BP 88-93% RA 44RR.

## 2018-08-03 NOTE — Code Documentation (Signed)
Pulse check. PEA on the monitor. CPR continued. 

## 2018-08-03 NOTE — ED Provider Notes (Signed)
[facility] Emergency Department Provider Note   ____________________________________________    I have reviewed the triage vital signs and the nursing notes.   HISTORY  Chief Complaint Blood Infection   History limited by patient distress  HPI [name] is a 57 y.o. female with a history of CVA with aphasia, left-sided hemiplegia, diabetes who presents with reports of altered mental status.  EMS reports hypoxia and low blood pressure on their arrival.  Rapid review of medical records demonstrates a history of aspiration pneumonia in the past.  Past Medical History:  Diagnosis Date  . Aphasia   . CVA (cerebral vascular accident) (HCC)   . Diabetes mellitus without complication (HCC)   . GERD (gastroesophageal reflux disease)   . Hemiplegia (HCC)   . Hypertension   . Lack of coordination   . Vertigo     Patient Active Problem List   Diagnosis Date Noted  . Malnutrition of moderate degree 06/09/2018  . Sepsis (HCC) 06/08/2018  . Tachycardia 08/27/2017  . Acute deep vein thrombosis (DVT) of left lower extremity (HCC) 08/22/2017  . Type 2 diabetes mellitus with complication (HCC) 08/22/2017  . Essential hypertension 08/22/2017    Past Surgical History:  Procedure Laterality Date  . IVC FILTER INSERTION N/A 08/28/2017   Procedure: IVC FILTER INSERTION;  Surgeon: Renford DillsSchnier, Gregory G, MD;  Location: [facility] INVASIVE CV LAB;  Service: Cardiovascular;  Laterality: N/A;  . PERIPHERAL VASCULAR THROMBECTOMY Left 08/28/2017   Procedure: PERIPHERAL VASCULAR THROMBECTOMY;  Surgeon: Renford DillsSchnier, Gregory G, MD;  Location: [facility] INVASIVE CV LAB;  Service: Cardiovascular;  Laterality: Left;    Prior to Admission medications   Medication Sig Start Date End Date Taking? Authorizing Provider  amLODipine (NORVASC) 10 MG tablet Place 10 mg into feeding tube at bedtime.     [provider]  bisacodyl (DULCOLAX) 10 MG suppository Place 10 mg rectally daily  as needed for moderate constipation.    [provider]  cloNIDine (CATAPRES - DOSED IN MG/24 HR) 0.3 mg/24hr patch Place 0.3 mg onto the skin every Thursday at 6pm.     [provider]  docusate (COLACE) 50 MG/5ML liquid Take 10 mLs (100 mg total) by mouth 2 (two) times daily. 06/11/18   Auburn BilberryPatel, Shreyang, MD  HYDROcodone-acetaminophen (NORCO/VICODIN) 5-325 MG tablet Take 1-2 tablets by mouth every 4 (four) hours as needed for moderate pain. 06/11/18   Auburn BilberryPatel, Shreyang, MD  insulin aspart (NOVOLOG) 100 UNIT/ML injection Inject three times daily with meals and at bedtime per sliding scale:  0-200: 0u 201-250: 2u 251-300: 4u 301-350: 6u 351-400: 8u 401-450: 10u +450: Contact MD    [provider]  insulin glargine (LANTUS) 100 UNIT/ML injection Inject 20 Units into the skin at bedtime.     [provider]  magnesium oxide (MAG-OX) 400 MG tablet Take 400 mg by mouth daily.    [provider]  Menthol, Topical Analgesic, (BIOFREEZE) 4 % GEL Apply 1 application topically every 8 (eight) hours as needed. Apply to left arm, shoulder, and elbow    [provider]  metoprolol tartrate (LOPRESSOR) 50 MG tablet Place 50 mg into feeding tube 2 (two) times daily.    [provider]  Multiple Vitamin (MULTIVITAMIN) LIQD Place 15 mLs into feeding tube daily.    [provider]  omeprazole (PRILOSEC) 2 mg/mL SUSP Place 10 mg into feeding tube daily.    [provider]  ondansetron (ZOFRAN) 4 MG tablet Place 4 mg into feeding  tube every 6 (six) hours as needed for nausea or vomiting.    [provider]  polyethylene glycol (MIRALAX / GLYCOLAX) packet Place 17 g into feeding tube at bedtime.     [provider]  Rivaroxaban (XARELTO) 15 MG TABS tablet Take 15 mg by mouth daily.    [provider]  traZODone (DESYREL) 50 MG tablet Take 0.5 tablets (25 mg total) by mouth at bedtime as needed for sleep. 06/11/18    Auburn Bilberry, MD  Water For Irrigation, Sterile (FREE WATER) SOLN Place 100 mLs into feeding tube every 4 (four) hours. 06/11/18   Auburn Bilberry, MD     Allergies Patient has no known allergies.  No family history on file.  Social History Social History   Tobacco Use  . Smoking status: Never Smoker  . Smokeless tobacco: Never Used  Substance Use Topics  . Alcohol use: No  . Drug use: Not on file    5 caveat: Unable to obtain review of Systems due to patient distress     ____________________________________________   PHYSICAL EXAM:  VITAL SIGNS: ED Triage Vitals [08-11-2018 0917]  Enc Vitals Group     BP (!) 53/30     Pulse Rate (!) 158     Resp (!) 38     Temp (!) 105.9 F (41.1 C)     Temp Source Rectal     SpO2      Weight      Height      Head Circumference      Peak Flow      Pain Score      Pain Loc      Pain Edu?      Excl. in GC?     Constitutional: Extremely ill-appearing  Head: Atraumatic.  Mouth/Throat: Mucous membranes are dry  Cardiovascular: Tachycardia, regular rhythm. Grossly normal heart sounds.  Cool extremities Respiratory: Respirations appear agonal Gastrointestinal: Mildly distended abdomen, G-tube noted Genitourinary: No erythema or crepitus Musculoskeletal: Cool, no mottling Neurologic: Unresponsive Skin: As above, no obvious cellulitis or skin break   ____________________________________________   LABS (all labs ordered are listed, but only abnormal results are displayed)  Labs Reviewed  CULTURE, BLOOD (ROUTINE X 2)  CULTURE, BLOOD (ROUTINE X 2)  URINE CULTURE  LACTIC ACID, PLASMA  LACTIC ACID, PLASMA  COMPREHENSIVE METABOLIC PANEL  CBC WITH DIFFERENTIAL/PLATELET  LIPASE, BLOOD  TROPONIN I  BLOOD GAS, ARTERIAL  APTT  PROTIME-INR  URINALYSIS, COMPLETE (UACMP) WITH MICROSCOPIC  TYPE AND SCREEN    ____________________________________________  EKG  None ____________________________________________  RADIOLOGY  None ____________________________________________   PROCEDURES  Procedure(s) performed: yes  Procedure Name: Intubation Date/Time: 08-11-18 10:14 AM Performed by: Jene Every, MD Pre-anesthesia Checklist: Patient identified, Emergency Drugs available, Suction available and Patient being monitored Preoxygenation: Pre-oxygenation with 100% oxygen Induction Type: IV induction and Rapid sequence Laryngoscope Size: Glidescope and 4 Grade View: Grade I Tube size: 7.5 mm Number of attempts: 2 Placement Confirmation: ETT inserted through vocal cords under direct vision,  CO2 detector and Breath sounds checked- equal and bilateral Tube secured with: ETT holder Dental Injury: Teeth and Oropharynx as per pre-operative assessment      Angiocath insertion Performed by: Jene Every  Consent: Verbal consent obtained. Risks and benefits: risks, benefits and alternatives were discussed Time out: Immediately prior to procedure a "time out" was called to verify the correct patient, procedure, equipment, support staff and site/side marked as required.  Preparation: Patient was prepped and draped in the usual  sterile fashion.  Vein Location: right brachial  Ultrasound Guided  Gauge: 18  Normal blood return and flush without difficulty Patient tolerance: Patient tolerated the procedure well with no immediate complications.      Critical Care performed: yes  CRITICAL CARE Performed by: Jene Everyobert Mauro Arps   Total critical care time: 30 minutes  Critical care time was exclusive of separately billable procedures and treating other patients.  Critical care was necessary to treat or prevent imminent or life-threatening deterioration.  Critical care was time spent personally by me on the following activities: development of treatment plan with patient and/or  surrogate as well as nursing, discussions with consultants, evaluation of patient's response to treatment, examination of patient, obtaining history from patient or surrogate, ordering and performing treatments and interventions, ordering and review of laboratory studies, ordering and review of radiographic studies, pulse oximetry and re-evaluation of patient's condition.  ____________________________________________   INITIAL IMPRESSION / ASSESSMENT AND PLAN / ED COURSE  Pertinent labs & imaging results that were available during my care of the patient were reviewed by me and considered in my medical decision making ([place] chart for details).  Patient presents with significant tachycardia, hypotension, rectal temperature of 105.9 despite cool extremities, strongly suggestive of severe septic shock.  Nurses having initial difficulty with IV access, I placed an ultrasound-guided 18-gauge in the right brachial and we immediately started fluids.  Highly irregular heart rate suggestive of sick sinus. Initial bp was ok  Despite IV fluid ministration patient rapidly developed bradycardia and worsening hypotension and lost pulses  Patient intubated by me with concurrent ACLS initiated   2 rounds of epinephrine, primary rhythm was PEA.  One brief episode of faint pulse noted but this rapidly diminished. Ultrasound demonstrated no meaningful cardiac activity  Time of death 10:08 AM called by me.  No family members at this time      ____________________________________________   FINAL CLINICAL IMPRESSION(S) / ED DIAGNOSES  Final diagnoses:  Septic shock (HCC)        Note:  This document was prepared using Dragon voice recognition software and may include unintentional dictation errors.    Jene EveryKinner, Zona Pedro, MD 08/07/2018 1019

## 2018-08-03 NOTE — Code Documentation (Signed)
Pulse check PEA. CPR continued

## 2018-08-03 NOTE — Code Documentation (Signed)
Pulse check. Dr. Cyril LoosenKinner performing bedside ultrasound of heart. No pulses present. CPR continued.

## 2018-08-03 NOTE — Code Documentation (Signed)
Patient time of death occurred at 1008 

## 2018-08-03 DEATH — deceased
